# Patient Record
Sex: Female | Born: 1990 | Race: White | Hispanic: No | Marital: Single | Smoking: Current every day smoker
Health system: Southern US, Community
[De-identification: ages and names within clinical notes are randomized; demographics above are authoritative.]

## PROBLEM LIST (undated history)

## (undated) ENCOUNTER — Emergency Department (HOSPITAL_COMMUNITY): Admission: EM | Payer: 59 | Source: Home / Self Care

## (undated) DIAGNOSIS — IMO0002 Reserved for concepts with insufficient information to code with codable children: Secondary | ICD-10-CM

## (undated) DIAGNOSIS — K219 Gastro-esophageal reflux disease without esophagitis: Secondary | ICD-10-CM

## (undated) DIAGNOSIS — G4700 Insomnia, unspecified: Secondary | ICD-10-CM

## (undated) DIAGNOSIS — F329 Major depressive disorder, single episode, unspecified: Secondary | ICD-10-CM

## (undated) DIAGNOSIS — F32A Depression, unspecified: Secondary | ICD-10-CM

## (undated) HISTORY — PX: APPENDECTOMY: SHX54

---
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
## 2009-02-25 ENCOUNTER — Emergency Department (HOSPITAL_COMMUNITY): Admission: EM | Admit: 2009-02-25 | Discharge: 2009-02-25 | Payer: Self-pay | Admitting: Emergency Medicine

## 2009-05-04 ENCOUNTER — Emergency Department (HOSPITAL_COMMUNITY): Admission: EM | Admit: 2009-05-04 | Discharge: 2009-05-04 | Payer: Self-pay | Admitting: Emergency Medicine

## 2009-07-10 ENCOUNTER — Emergency Department (HOSPITAL_COMMUNITY): Admission: EM | Admit: 2009-07-10 | Discharge: 2009-07-10 | Payer: Self-pay | Admitting: Emergency Medicine

## 2009-10-05 IMAGING — CR DG ABDOMEN 1V
1 series · 1 of 1 positions shown · non-contrast
Comparison: None

CLINICAL DATA: Abdominal pain, nausea, vomiting

ABDOMEN - 1 VIEW

[view not recorded]
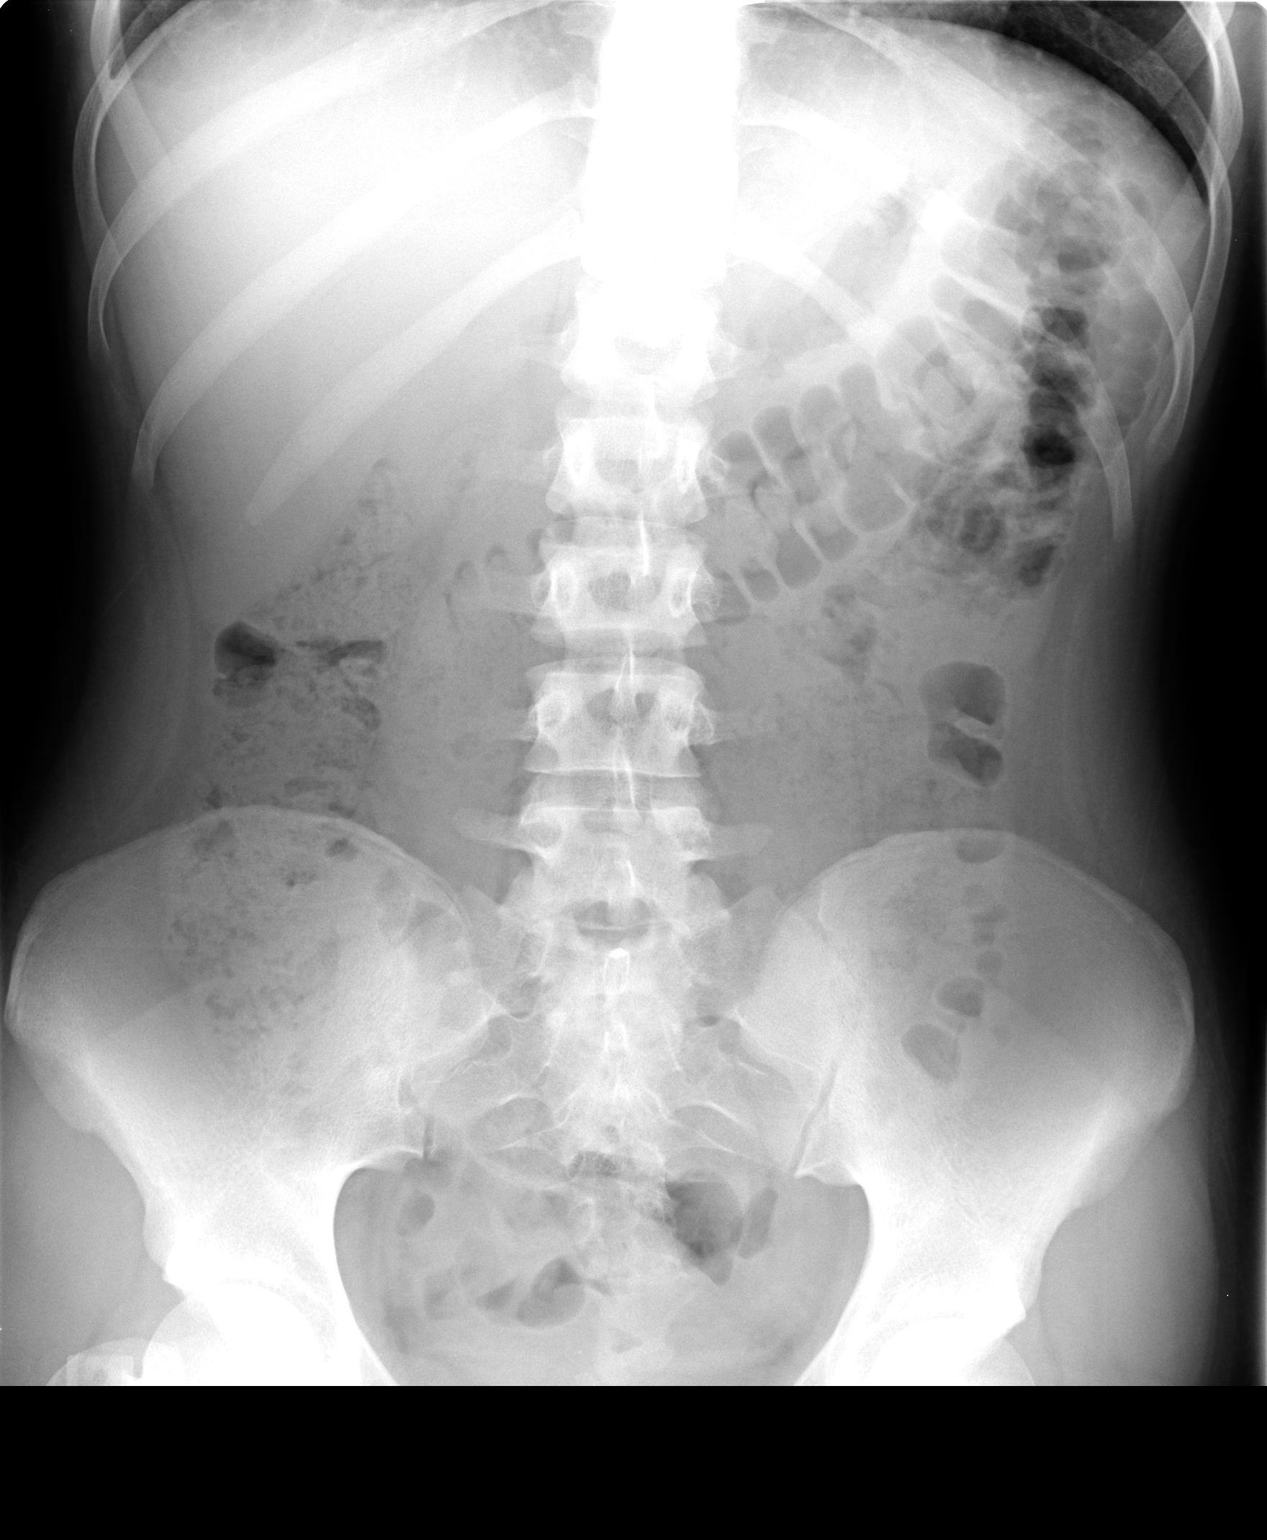

[1 of 1 positions shown; findings below may reference images not displayed]

FINDINGS: Stool throughout right colon.
Otherwise normal bowel gas pattern.
Bones unremarkable.
No pathologic calcification.
IMPRESSION: No acute abnormalities.

## 2009-10-14 IMAGING — CR DG PELVIS 1-2V
1 series · 1 of 1 positions shown · non-contrast
Comparison: None

CLINICAL DATA: Fell today.  Pain in the posterior pelvis.

PELVIS - 1-2 VIEW

[view not recorded]
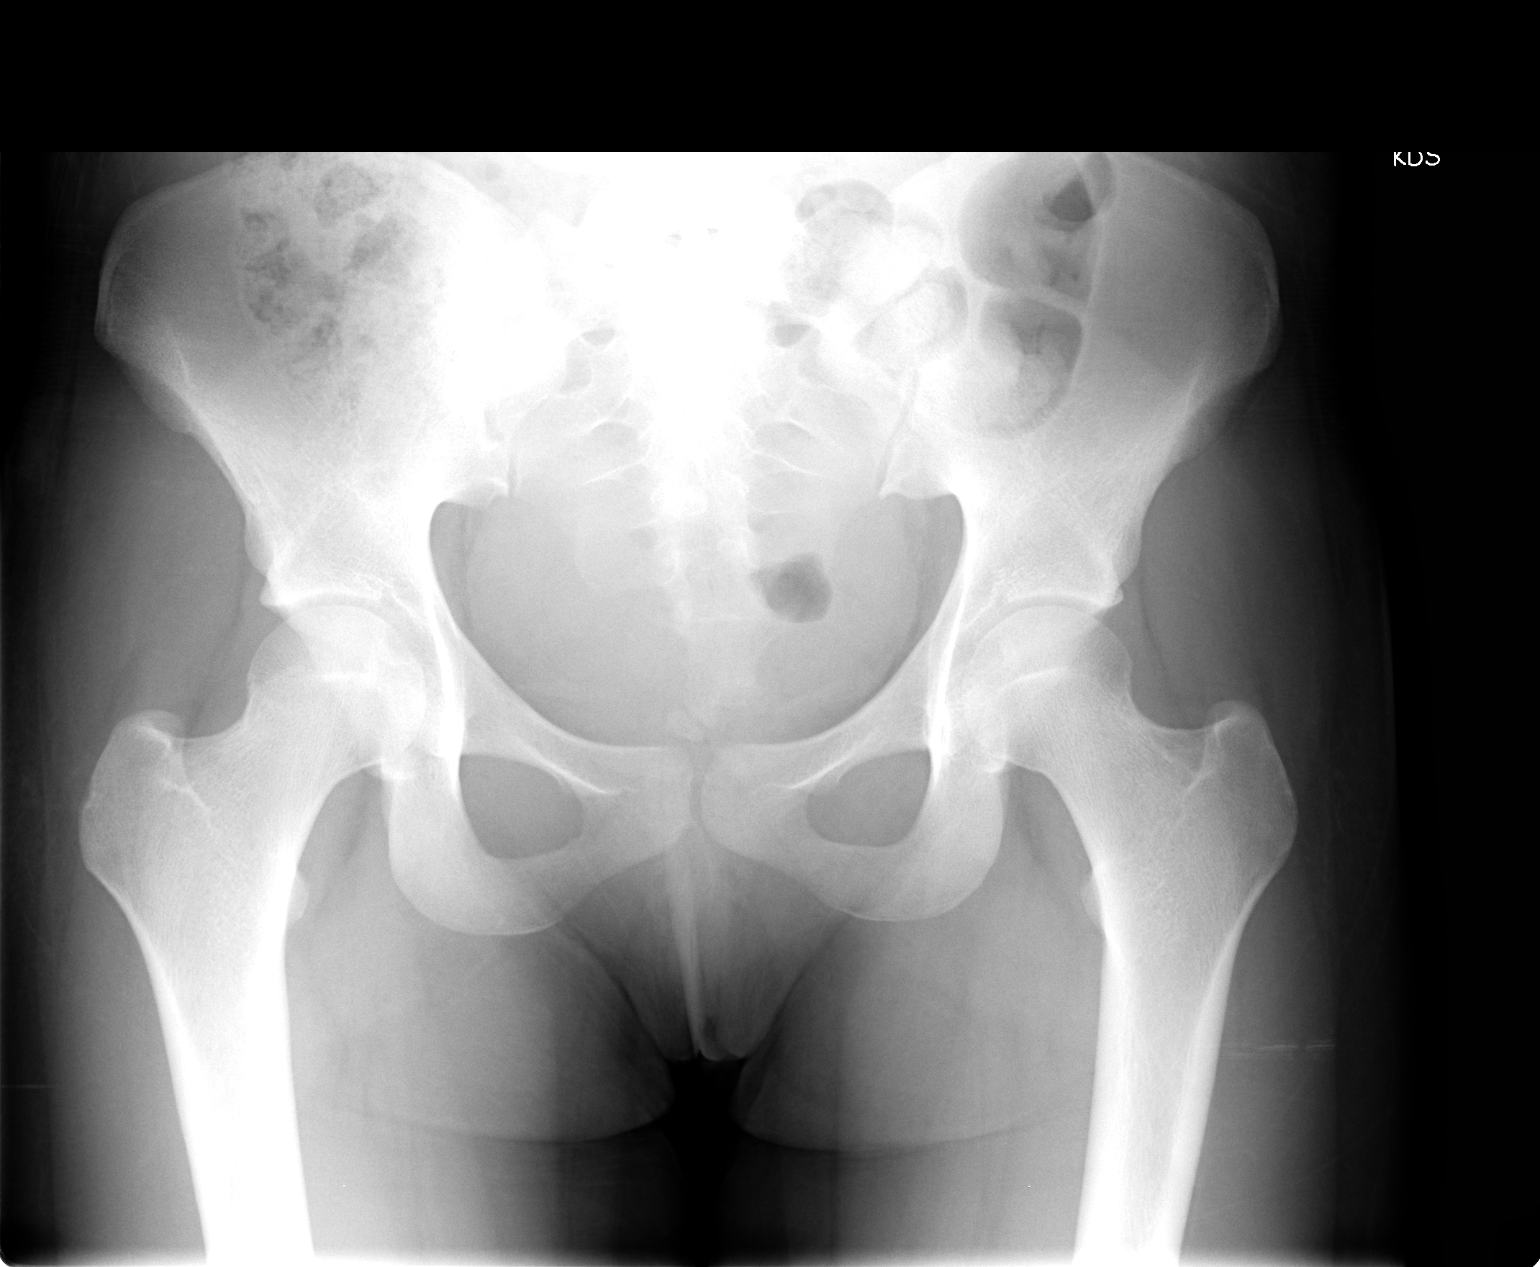

[1 of 1 positions shown; findings below may reference images not displayed]

FINDINGS: There is no evidence for acute fracture or dislocation.
Regional bowel gas has a normal appearance.
IMPRESSION: No evidence for acute abnormality.

## 2009-12-05 ENCOUNTER — Inpatient Hospital Stay (HOSPITAL_COMMUNITY): Admission: AD | Admit: 2009-12-05 | Discharge: 2009-12-08 | Payer: Self-pay | Admitting: Obstetrics & Gynecology

## 2009-12-05 ENCOUNTER — Ambulatory Visit: Payer: Self-pay | Admitting: Family

## 2009-12-05 ENCOUNTER — Inpatient Hospital Stay (HOSPITAL_COMMUNITY): Admission: AD | Admit: 2009-12-05 | Discharge: 2009-12-05 | Payer: Self-pay | Admitting: Obstetrics & Gynecology

## 2010-05-04 ENCOUNTER — Emergency Department (HOSPITAL_COMMUNITY): Admission: EM | Admit: 2010-05-04 | Discharge: 2010-05-04 | Payer: Self-pay | Admitting: Emergency Medicine

## 2010-05-27 ENCOUNTER — Encounter (INDEPENDENT_AMBULATORY_CARE_PROVIDER_SITE_OTHER): Payer: Self-pay | Admitting: General Surgery

## 2010-05-27 ENCOUNTER — Observation Stay (HOSPITAL_COMMUNITY): Admission: EM | Admit: 2010-05-27 | Discharge: 2010-05-27 | Payer: Self-pay | Admitting: Emergency Medicine

## 2011-02-01 LAB — POCT PREGNANCY, URINE: Preg Test, Ur: NEGATIVE

## 2011-02-01 LAB — DIFFERENTIAL
Basophils Absolute: 0 10*3/uL (ref 0.0–0.1)
Basophils Relative: 0 % (ref 0–1)
Eosinophils Relative: 1 % (ref 0–5)
Lymphocytes Relative: 18 % (ref 12–46)
Lymphs Abs: 1.6 10*3/uL (ref 0.7–4.0)
Monocytes Absolute: 0.5 10*3/uL (ref 0.1–1.0)
Neutrophils Relative %: 75 % (ref 43–77)

## 2011-02-01 LAB — BASIC METABOLIC PANEL
BUN: 10 mg/dL (ref 6–23)
CO2: 24 mEq/L (ref 19–32)
Chloride: 105 mEq/L (ref 96–112)
Glucose, Bld: 135 mg/dL — ABNORMAL HIGH (ref 70–99)
Potassium: 3.8 mEq/L (ref 3.5–5.1)

## 2011-02-01 LAB — URINALYSIS, ROUTINE W REFLEX MICROSCOPIC
Glucose, UA: NEGATIVE mg/dL
Leukocytes, UA: NEGATIVE
Nitrite: NEGATIVE
Urobilinogen, UA: 0.2 mg/dL (ref 0.0–1.0)
pH: 6 (ref 5.0–8.0)

## 2011-02-01 LAB — URINE MICROSCOPIC-ADD ON

## 2011-02-01 LAB — CBC
HCT: 38.5 % (ref 36.0–46.0)
MCHC: 35 g/dL (ref 30.0–36.0)
MCV: 92.7 fL (ref 78.0–100.0)

## 2011-02-01 LAB — LIPASE, BLOOD: Lipase: 28 U/L (ref 11–59)

## 2011-02-02 LAB — CBC
RBC: 3.76 MIL/uL — ABNORMAL LOW (ref 3.87–5.11)
WBC: 14.2 10*3/uL — ABNORMAL HIGH (ref 4.0–10.5)

## 2011-02-23 LAB — URINALYSIS, ROUTINE W REFLEX MICROSCOPIC
Bilirubin Urine: NEGATIVE
Glucose, UA: NEGATIVE mg/dL
Leukocytes, UA: NEGATIVE
Urobilinogen, UA: 0.2 mg/dL (ref 0.0–1.0)
pH: 5.5 (ref 5.0–8.0)

## 2011-02-23 LAB — HCG, QUANTITATIVE, PREGNANCY: hCG, Beta Chain, Quant, S: 69795 m[IU]/mL — ABNORMAL HIGH (ref <5)

## 2011-02-23 LAB — URINE MICROSCOPIC-ADD ON

## 2011-03-31 NOTE — H&P (Signed)
[name], [name]             ACCOUNT NO.:  [id number]   MEDICAL RECORD NO.:  [id number]          PATIENT TYPE:  INP   LOCATION:  0105                          FACILITY:  [facility]   PHYSICIAN:  Lalla Brothers, MDDATE OF BIRTH:  [birth year]   DATE OF ADMISSION:  09/02/2007  DATE OF DISCHARGE:                       PSYCHIATRIC ADMISSION ASSESSMENT   IDENTIFICATION:  This 6[age]54/20-year-old female, who dropped out of the  11th grade at [employer] this fall, is admitted emergently  involuntarily on a [facility] petition for commitment in transfer  from [facility] Emergency Department for inpatient  stabilization and treatment of depression and suicide risk.  The patient  was accompanied by mother who has had a stroke in August of 2008 to the  emergency department for a transverse laceration of the left dorsal  forearm requiring 12 sutures.  The patient has multiple other carvings  and cuttings on the left upper extremity and has a history of cutting  two years before, requiring therapy at [facility].  The patient appears to have a relapse in significant depression but at  this time presents as being overwhelmed with mother's stroke in August  of 2008 which apparently has plateaued in improvement.  The patient had  a knife or a razor to self-inflict a laceration and could not contract  for safety.   HISTORY OF PRESENT ILLNESS:  The patient appears to have longstanding  neurotic anxiety or characterologic sarcastic doubt and devaluation of  the relations and interactions promised by others.  The patient's father  died when the patient was 88 years of age.  Mother has now had a stroke  in August of 2008 and is not recovering completely.  The patient has  aspirations to be a Risk analyst and has had to drop out of school  to stay with mother.  The patient is sleeping two or three hours nightly  currently.  She has had weight loss and  diminished appetite.  The  patient is morbidly fixated and self-injurious.  She seems to have some  guilty ruminations at the same time that she is angry and beginning to  open up about these.  The patient has had no organic central nervous  system trauma.  She is not acknowledging post-traumatic flashbacks or  reexperiencing.  She will not share the content or accomplishments of  therapy two years ago at [facility] but it appeared  to have been for cutting and depression.  She has no other treatment  including no pharmacotherapy.  She smokes one pack per day of cigarettes  but denies the use of any alcohol or illicit drugs.  She has had no  organic central nervous system trauma.  The patient has attempted to  take care of mother but is currently stressing mother by her own needs.   PAST MEDICAL HISTORY:  The patient has 12 sutures in the left dorsal  forearm in a running fashion of 4.0 nylon.  The patient is to see Dr.  Syliva Overman in [place] for suture removal in 10 days.  The  patient cut through  the subcutaneous tissue but not into vital  structures.  The patient has multiple sites of self-cutting and carving  including the words life, love and hate.  She was in the emergency  department in April of 2005 with right knee pain with x-ray negative at  that time.  On August 22, 2007, she had an internal hordeolum of the  left eye that is resolved.  Last menses was August 16, 2007 and  menses have been irregular.  She has gastroesophageal reflux and  possibly some gastritis and has been taking Prilosec daily for the  intense burning.  She has had an ovarian cyst in the past.  She had  chicken pox in the second grade.  She wears eyeglasses.  Last dental  exam was one week ago.  She does have headaches at times.  She has no  medication allergies.  She has no seizures or syncope.  She has no heart  murmur or arrhythmia.   REVIEW OF SYSTEMS:  The patient  denies difficulty with gait, gaze or  continence.  She denies exposure to communicable disease or toxins.  She  has no headache or sensory loss currently.  There is no memory loss or  coordination deficit.  There is no cough, congestion, chest pain,  palpitations, dyspnea or presyncope.  There is no abdominal pain,  nausea, vomiting or diarrhea.  There is no dysuria or arthralgia.   IMMUNIZATIONS:  Up-to-date.   FAMILY HISTORY:  The patient resides with mother and stepfather as well  as a brother.  The patient and brother have significantly placed their  lives on hold to help the mother.  The mother apparently made some  initial progress but has plateaued.  Mother had a stroke in 2007/07/11.  The patient's father died when the patient was 44 years of age.   SOCIAL AND DEVELOPMENTAL HISTORY:  The patient dropped out of the 11th  grade at [employer] in order to help mother recover from her  stroke.  Mother has made only partial progress.  The patient wants a  career in Primary school teacher.  She likes music, wrestling, drawing and is  employed at [employer] though less than 32 hours weekly.  She does use  tobacco as one pack per day of cigarettes but denies alcohol or illicit  drugs.  She denies other legal charges currently.  She does not answer  questions about sexual activity.   ASSETS:  The patient has educational and occupational plans for the  future.   MENTAL STATUS EXAM:  Height is 155 cm and weight is 48 kg.  Blood  pressure is 102/72 with heart rate of 77 (sitting) and 106/72 with heart  rate of 90 (standing).  She is right-handed.  Her hair is cut short  without a specific shape in a masculine fashion.  She has life, love and  hate carved into her forearms.  She has the laceration on the left volar  forearm and scattered abrasions elsewhere on the left upper extremity.  Cranial nerves 2-12 are intact.  Muscle strengths and tone are normal.  There are no pathologic  reflexes or soft neurologic findings.  The  patient has severe dysphoria and is significantly defensive about  current and past family losses.  The patient has a somewhat alienating  and sarcastic interpersonal style that softens with female nursing staff  to tabulate losses and consequences.  She has identity defensiveness,  diffusion and dissonance, at times manifesting borderline,  passive-  aggressive or narcissistic traits though these are not pervasive.  She  has severe dysphoria and object loss.  She seems to have neurotic  anxiety to be distinguished in ongoing treatment from character  dysfunction.  She has no mania or psychosis.  She is hypersensitive to  the comments or actions of others.  She has suicidal ideation and  intent.   IMPRESSION:  AXIS I:  Major depression, recurrent, severe.  Anxiety  disorder not otherwise specified.  Identity disorder.  Parent-child  problem.  Other interpersonal problem.  Other specified family  circumstances.  AXIS II:  Diagnosis deferred.  AXIS III:  Lacerations self-inflicted, particularly the left upper  extremity, gastroesophageal reflux disorder and possible gastritis,  recent left eye internal hordeolum, irregular menses, eyeglasses,  headache.  AXIS IV:  Stressors:  Family--extreme, acute and chronic; phase of life-  -extreme, acute and chronic; school--moderate, acute and chronic;  medical--mild, acute and chronic.  AXIS V:  GAF on admission 30; highest in last year estimated at 72.   PLAN:  The patient is admitted for inpatient adolescent psychiatric and  multidisciplinary multimodal behavioral health treatment in a team-based  programmatic locked psychiatric unit.  Nicoderm patch is made available  and wound care is planned including with Neosporin, multivitamin and  dressing changes.  Prozac pharmacotherapy is to be considered though  mother could not come to the phone and stepfather was not available with  multiple trials and  calls.  Celexa might be another option.  Cognitive  behavioral therapy, anger management, interpersonal therapy, habit  reversal, identity consolidation, family intervention, individuation  separation, social and communication skill training and problem-solving  and coping skill training can be undertaken for therapy.   ESTIMATED LENGTH OF STAY:  Seven days with target symptoms for discharge  being stabilization of suicide risk and mood, stabilization of  dangerous, disruptive behavior and highly defended anxiety and  generalization of the capacity for safe, effective participation in  outpatient treatment.      Lalla Brothers, MD  Electronically Signed     GEJ/MEDQ  D:  09/03/2007  T:  09/04/2007  Job:  [id number]
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
## 2011-08-15 ENCOUNTER — Encounter: Payer: Self-pay | Admitting: *Deleted

## 2011-08-15 ENCOUNTER — Emergency Department (HOSPITAL_COMMUNITY)
Admission: EM | Admit: 2011-08-15 | Discharge: 2011-08-15 | Disposition: A | Discharge status: Home / Self Care | Payer: Medicaid Other | Attending: Emergency Medicine | Admitting: Emergency Medicine

## 2011-08-15 ENCOUNTER — Emergency Department (HOSPITAL_COMMUNITY): Payer: Medicaid Other

## 2011-08-15 DIAGNOSIS — S6390XA Sprain of unspecified part of unspecified wrist and hand, initial encounter: Secondary | ICD-10-CM | POA: Insufficient documentation

## 2011-08-15 DIAGNOSIS — S63619A Unspecified sprain of unspecified finger, initial encounter: Secondary | ICD-10-CM

## 2011-08-15 DIAGNOSIS — IMO0002 Reserved for concepts with insufficient information to code with codable children: Secondary | ICD-10-CM | POA: Insufficient documentation

## 2011-08-15 DIAGNOSIS — F17200 Nicotine dependence, unspecified, uncomplicated: Secondary | ICD-10-CM | POA: Insufficient documentation

## 2011-08-15 DIAGNOSIS — M79609 Pain in unspecified limb: Secondary | ICD-10-CM | POA: Insufficient documentation

## 2011-08-15 NOTE — ED Provider Notes (Signed)
History     CSN: [id number] Arrival date & time: 08/15/2011 11:13 AM  Chief Complaint  Patient presents with  . Hand Pain    (Consider location/radiation/quality/duration/timing/severity/associated sxs/prior treatment) Patient is a 20 y.o. female presenting with hand pain. The history is provided by the patient.  Hand Pain This is a recurrent problem. The current episode started 1 to 4 weeks ago. The problem occurs intermittently. The problem has been gradually worsening. Pertinent negatives include no abdominal pain, arthralgias, chest pain, coughing or neck pain. Exacerbated by: Movement of the rt fifth finger. She has tried acetaminophen for the symptoms. The treatment provided no relief.    History reviewed. No pertinent past medical history.  Past Surgical History  Procedure Date  . Appendectomy     History reviewed. No pertinent family history.  History  Substance Use Topics  . Smoking status: Current Everyday Smoker -- 2.0 packs/day  . Smokeless tobacco: Not on file  . Alcohol Use: No    OB History    Grav Para Term Preterm Abortions TAB SAB Ect Mult Living                  Review of Systems  Constitutional: Negative for activity change.       All ROS Neg except as noted in HPI  HENT: Negative for nosebleeds and neck pain.   Eyes: Negative for photophobia and discharge.  Respiratory: Negative for cough, shortness of breath and wheezing.   Cardiovascular: Negative for chest pain and palpitations.  Gastrointestinal: Negative for abdominal pain and blood in stool.  Genitourinary: Negative for dysuria, frequency and hematuria.  Musculoskeletal: Negative for back pain and arthralgias.  Skin: Negative.   Neurological: Negative for dizziness, seizures and speech difficulty.  Psychiatric/Behavioral: Negative for hallucinations and confusion.    Allergies  Penicillins  Home Medications  No current outpatient prescriptions on file.  BP 110/66  Pulse 62   Temp(Src) 98.2 F (36.8 C) (Oral)  Resp 18  Ht 5\' 4"  (1.626 m)  Wt 110 lb (49.896 kg)  BMI 18.88 kg/m2  SpO2 96%  LMP 08/08/2011  Physical Exam  Nursing note and vitals reviewed. Constitutional: She is oriented to person, place, and time. She appears well-developed and well-nourished.  Non-toxic appearance.  HENT:  Head: Normocephalic.  Right Ear: Tympanic membrane and external ear normal.  Left Ear: Tympanic membrane and external ear normal.  Eyes: EOM and lids are normal. Pupils are equal, round, and reactive to light.  Neck: Normal range of motion. Neck supple. Carotid bruit is not present.  Cardiovascular: Normal rate, regular rhythm, normal heart sounds, intact distal pulses and normal pulses.   Pulmonary/Chest: Breath sounds normal. No respiratory distress.  Abdominal: Soft. Bowel sounds are normal. There is no tenderness. There is no guarding.  Musculoskeletal: Normal range of motion.       Pain at the MP of the right 5th finger. Mild swelling. Good cap refill. Not hot. Sensory wnl.  Lymphadenopathy:       Head (right side): No submandibular adenopathy present.       Head (left side): No submandibular adenopathy present.    She has no cervical adenopathy.  Neurological: She is alert and oriented to person, place, and time. She has normal strength. No cranial nerve deficit or sensory deficit.  Skin: Skin is warm and dry.  Psychiatric: She has a normal mood and affect. Her speech is normal.    ED Course  Procedures (including critical care time)  Labs Reviewed -  No data to display Dg Finger Little Right  08/15/2011  *RADIOLOGY REPORT*  Clinical Data: Jammed little finger.  Proximal finger pain.  RIGHT LITTLE FINGER 2+V  Comparison: None.  Findings: No evidence of fracture or dislocation.  No other significant bone abnormality identified.  Soft tissues are unremarkable.  IMPRESSION: Negative.  Original Report Authenticated By: Danae Orleans, M.D.     Dx: Finger  sprain   MDM  I have reviewed nursing notes, vital signs, and all appropriate lab and imaging results for this patient.  Plan: finger splint. See Dr Romeo Apple if not improving.     Kathie Dike, [place] 08/15/11 1242

## 2011-08-15 NOTE — ED Notes (Signed)
Pt c/o pain to her right small finger. Pt states she bent her finger back about 3 weeks ago and it has been hurting worse since then.

## 2011-08-15 NOTE — ED Notes (Signed)
Pt c/o pain in her right little finger. States that she injured it a couple days ago. Able to bend finger but causes pain. Pt has strong radial pulse and finger pink and warm to touch.

## 2011-08-15 NOTE — ED Provider Notes (Signed)
Medical screening examination/treatment/procedure(s) were performed by non-physician practitioner and as supervising physician I was immediately available for consultation/collaboration.   Joya Gaskins, MD 08/15/11 [phone]

## 2011-08-26 LAB — CBC
Hemoglobin: 13.8
RBC: 4.4
RDW: 13.1

## 2011-08-26 LAB — URINALYSIS, ROUTINE W REFLEX MICROSCOPIC
Bilirubin Urine: NEGATIVE
Glucose, UA: NEGATIVE
Hgb urine dipstick: NEGATIVE
Ketones, ur: NEGATIVE
Protein, ur: NEGATIVE
Urobilinogen, UA: 0.2
pH: 7

## 2011-08-26 LAB — BASIC METABOLIC PANEL
Calcium: 9.7
Glucose, Bld: 101 — ABNORMAL HIGH
Sodium: 140

## 2011-08-26 LAB — HEPATIC FUNCTION PANEL
ALT: 10
AST: 16
Indirect Bilirubin: 0.7
Total Protein: 6.8

## 2011-08-26 LAB — DIFFERENTIAL
Basophils Absolute: 0.1
Lymphocytes Relative: 30
Monocytes Absolute: 0.8
Neutro Abs: 6.9 — ABNORMAL HIGH
Neutrophils Relative %: 62

## 2011-08-26 LAB — RAPID URINE DRUG SCREEN, HOSP PERFORMED
Amphetamines: NOT DETECTED
Tetrahydrocannabinol: NOT DETECTED

## 2011-08-26 LAB — GAMMA GT: GGT: 18

## 2011-08-26 LAB — TSH: TSH: 1.099

## 2012-04-14 ENCOUNTER — Encounter (HOSPITAL_COMMUNITY): Payer: Self-pay | Admitting: *Deleted

## 2012-04-14 ENCOUNTER — Emergency Department (HOSPITAL_COMMUNITY): Payer: Self-pay

## 2012-04-14 ENCOUNTER — Emergency Department (HOSPITAL_COMMUNITY)
Admission: EM | Admit: 2012-04-14 | Discharge: 2012-04-14 | Disposition: A | Discharge status: Home / Self Care | Payer: Self-pay | Attending: Emergency Medicine | Admitting: Emergency Medicine

## 2012-04-14 DIAGNOSIS — J40 Bronchitis, not specified as acute or chronic: Secondary | ICD-10-CM | POA: Insufficient documentation

## 2012-04-14 DIAGNOSIS — R059 Cough, unspecified: Secondary | ICD-10-CM | POA: Insufficient documentation

## 2012-04-14 DIAGNOSIS — R05 Cough: Secondary | ICD-10-CM | POA: Insufficient documentation

## 2012-04-14 DIAGNOSIS — R079 Chest pain, unspecified: Secondary | ICD-10-CM | POA: Insufficient documentation

## 2012-04-14 DIAGNOSIS — M549 Dorsalgia, unspecified: Secondary | ICD-10-CM | POA: Insufficient documentation

## 2012-04-14 DIAGNOSIS — F17200 Nicotine dependence, unspecified, uncomplicated: Secondary | ICD-10-CM | POA: Insufficient documentation

## 2012-04-14 DIAGNOSIS — R0789 Other chest pain: Secondary | ICD-10-CM

## 2012-04-14 DIAGNOSIS — K219 Gastro-esophageal reflux disease without esophagitis: Secondary | ICD-10-CM | POA: Insufficient documentation

## 2012-04-14 HISTORY — DX: Depression, unspecified: F32.A

## 2012-04-14 HISTORY — DX: Reserved for concepts with insufficient information to code with codable children: IMO0002

## 2012-04-14 HISTORY — DX: Gastro-esophageal reflux disease without esophagitis: K21.9

## 2012-04-14 HISTORY — DX: Major depressive disorder, single episode, unspecified: F32.9

## 2012-04-14 HISTORY — DX: Insomnia, unspecified: G47.00

## 2012-04-14 MED ORDER — GUAIFENESIN-CODEINE 100-10 MG/5ML PO SYRP: ORAL_SOLUTION | ORAL | Status: DC

## 2012-04-14 NOTE — ED Notes (Signed)
Productive of green phelgm x 4 weeks, c/o mid back pain for 2.5 weeks

## 2012-04-14 NOTE — ED Notes (Signed)
Patient transported to X-ray 

## 2012-04-14 NOTE — ED Provider Notes (Signed)
History     CSN: [id number]  Arrival date & time 04/14/12  1933   First MD Initiated Contact with Patient 04/14/12 2033      Chief Complaint  Patient presents with  . Cough  . Back Pain    (Consider location/radiation/quality/duration/timing/severity/associated sxs/prior treatment) HPI Comments: Cough prod of green sputum ~ 1 month.  "sharp" CP to L anterior and posterior lateral  Area.  Worse with deep inspiration and coughing..  No fever or chills.  No n/v, diaphoresis or radiation.  Patient is a 21 y.o. female presenting with cough and back pain. The history is provided by the patient. No language interpreter was used.  Cough This is a new problem. Episode onset: 1 month ago. The problem occurs constantly. The problem has not changed since onset.The cough is productive of sputum. There has been no fever. Associated symptoms include chest pain. Pertinent negatives include no chills, no weight loss, no shortness of breath and no wheezing. She has tried nothing for the symptoms. She is a smoker. Her past medical history does not include bronchitis, pneumonia, bronchiectasis, COPD, emphysema or asthma.  Back Pain  Associated symptoms include chest pain. Pertinent negatives include no fever and no weight loss.    Past Medical History  Diagnosis Date  . Insomnia   . Depression   . Ulcer   . GERD (gastroesophageal reflux disease)     Past Surgical History  Procedure Date  . Appendectomy     History reviewed. No pertinent family history.  History  Substance Use Topics  . Smoking status: Current Everyday Smoker -- 2.0 packs/day for 4 years    Types: Cigarettes  . Smokeless tobacco: Not on file  . Alcohol Use: No    OB History    Grav Para Term Preterm Abortions TAB SAB Ect Mult Living                  Review of Systems  Constitutional: Negative for fever, chills and weight loss.  Respiratory: Positive for cough. Negative for shortness of breath and wheezing.     Cardiovascular: Positive for chest pain.  Musculoskeletal: Positive for back pain.  All other systems reviewed and are negative.    Allergies  Penicillins and Latex  Home Medications   Current Outpatient Rx  Name Route Sig Dispense Refill  . ACETAMINOPHEN 500 MG PO TABS Oral Take 1,000 mg by mouth once as needed. For pain    . OMEPRAZOLE 20 MG PO CPDR Oral Take 20 mg by mouth daily.    . GUAIFENESIN-CODEINE 100-10 MG/5ML PO SYRP  10 mls po q 4-6 hrs prn cough 240 mL 0    BP 115/68  Pulse 83  Temp(Src) 98.1 F (36.7 C) (Oral)  Resp 20  Ht 5\' 2"  (1.575 m)  Wt 125 lb (56.7 kg)  BMI 22.86 kg/m2  SpO2 100%  LMP 03/20/2012  Physical Exam  Nursing note and vitals reviewed. Constitutional: She is oriented to person, place, and time. She appears well-developed and well-nourished. No distress.  HENT:  Head: Normocephalic and atraumatic.  Eyes: EOM are normal.  Neck: Normal range of motion.  Cardiovascular: Normal rate, regular rhythm and normal heart sounds.   Pulmonary/Chest: Effort normal and breath sounds normal. No respiratory distress. She has no wheezes. She has no rales.   She exhibits no tenderness.    Abdominal: Soft. She exhibits no distension. There is no tenderness.  Musculoskeletal: Normal range of motion.  Neurological: She is alert and oriented  to person, place, and time.  Skin: Skin is warm and dry.  Psychiatric: She has a normal mood and affect. Judgment normal.    ED Course  Procedures (including critical care time)  Labs Reviewed - No data to display Dg Chest 2 View  04/14/2012  *RADIOLOGY REPORT*  Clinical Data: Cough for 4 weeks.  Left lateral chest pain. Smoker.  CHEST - 2 VIEW  Comparison: None.  Findings: Hyperinflation suggesting asthma versus emphysematous changes.  Normal heart size and pulmonary vascularity.  Azygos lobe.  No focal airspace consolidation.  No blunting of costophrenic angles.  No pneumothorax.  Calcified granuloma in the right  apex.  IMPRESSION: Hyperinflation suggesting asthma versus emphysema.  No evidence of active pulmonary disease.  Original Report Authenticated By: Marlon Pel, M.D.     1. Chest wall pain   2. Bronchitis       MDM  Nl CXR.  No cardiac sxs.   rx robitussin AC, 240 ml        Worthy Rancher, [place] 04/14/12 2208

## 2012-04-14 NOTE — Discharge Instructions (Signed)
Bronchitis Bronchitis is a problem of the air tubes leading to your lungs. This problem makes it hard for air to get in and out of the lungs. You may cough a lot because your air tubes are narrow. Going without care can cause lasting (chronic) bronchitis. HOME CARE   Drink enough fluids to keep your pee (urine) clear or pale yellow.   Use a cool mist humidifier.   Quit smoking if you smoke. If you keep smoking, the bronchitis might not get better.   Only take medicine as told by your doctor.  GET HELP RIGHT AWAY IF:   Coughing keeps you awake.   You start to wheeze.   You become more sick or weak.   You have a hard time breathing or get short of breath.   You cough up blood.   Coughing lasts more than 2 weeks.   You have a fever.   Your baby is older than 3 months with a rectal temperature of 102 F (38.9 C) or higher.   Your baby is 71 months old or younger with a rectal temperature of 100.4 F (38 C) or higher.  MAKE SURE YOU:  Understand these instructions.   Will watch your condition.   Will get help right away if you are not doing well or get worse.  Document Released: 04/20/2008 Document Revised: 10/22/2011 Document Reviewed: 10/04/2009 [facility] Patient Information 2012 [place], [place].Chest Wall Pain Chest wall pain is pain in or around the bones and muscles of your chest. It may take up to 6 weeks to get better. It may take longer if you must stay physically active in your work and activities.  CAUSES  Chest wall pain may happen on its own. However, it may be caused by:  A viral illness like the flu.   Injury.   Coughing.   Exercise.   Arthritis.   Fibromyalgia.   Shingles.  HOME CARE INSTRUCTIONS   Avoid overtiring physical activity. Try not to strain or perform activities that cause pain. This includes any activities using your chest or your abdominal and side muscles, especially if heavy weights are used.   Put ice on the sore area.   Put ice  in a plastic bag.   Place a towel between your skin and the bag.   Leave the ice on for 15 to 20 minutes per hour while awake for the first 2 days.   Only take over-the-counter or prescription medicines for pain, discomfort, or fever as directed by your caregiver.  SEEK IMMEDIATE MEDICAL CARE IF:   Your pain increases, or you are very uncomfortable.   You have a fever.   Your chest pain becomes worse.   You have new, unexplained symptoms.   You have nausea or vomiting.   You feel sweaty or lightheaded.   You have a cough with phlegm (sputum), or you cough up blood.  MAKE SURE YOU:   Understand these instructions.   Will watch your condition.   Will get help right away if you are not doing well or get worse.  Document Released: 11/02/2005 Document Revised: 10/22/2011 Document Reviewed: 06/29/2011 [facility] Patient Information 2012 [place], [place].   Take the cough medicine as directed.  Take ibuprofen up to 600 mg every 8 hrs with food.  Follow up with your MD as needed.

## 2012-04-14 NOTE — ED Notes (Signed)
Pt has been coughing for 2 weeks, back started hurting along with cough. Pt denies SOB, complains of pain when coughing.

## 2012-04-15 NOTE — ED Provider Notes (Signed)
Medical screening examination/treatment/procedure(s) were performed by non-physician practitioner and as supervising physician I was immediately available for consultation/collaboration.   Glynn Octave, MD 04/15/12 [phone]

## 2012-05-18 ENCOUNTER — Emergency Department (HOSPITAL_COMMUNITY): Payer: Medicaid Other

## 2012-05-18 ENCOUNTER — Encounter (HOSPITAL_COMMUNITY): Payer: Self-pay | Admitting: *Deleted

## 2012-05-18 ENCOUNTER — Emergency Department (HOSPITAL_COMMUNITY)
Admission: EM | Admit: 2012-05-18 | Discharge: 2012-05-18 | Disposition: A | Discharge status: Home / Self Care | Payer: Medicaid Other | Attending: Emergency Medicine | Admitting: Emergency Medicine

## 2012-05-18 DIAGNOSIS — K219 Gastro-esophageal reflux disease without esophagitis: Secondary | ICD-10-CM | POA: Insufficient documentation

## 2012-05-18 DIAGNOSIS — F3289 Other specified depressive episodes: Secondary | ICD-10-CM | POA: Insufficient documentation

## 2012-05-18 DIAGNOSIS — F329 Major depressive disorder, single episode, unspecified: Secondary | ICD-10-CM | POA: Insufficient documentation

## 2012-05-18 DIAGNOSIS — R112 Nausea with vomiting, unspecified: Secondary | ICD-10-CM | POA: Insufficient documentation

## 2012-05-18 DIAGNOSIS — F17200 Nicotine dependence, unspecified, uncomplicated: Secondary | ICD-10-CM | POA: Insufficient documentation

## 2012-05-18 DIAGNOSIS — R197 Diarrhea, unspecified: Secondary | ICD-10-CM | POA: Insufficient documentation

## 2012-05-18 DIAGNOSIS — R109 Unspecified abdominal pain: Secondary | ICD-10-CM | POA: Insufficient documentation

## 2012-05-18 LAB — COMPREHENSIVE METABOLIC PANEL
ALT: 10 U/L (ref 0–35)
AST: 15 U/L (ref 0–37)
Albumin: 4.5 g/dL (ref 3.5–5.2)
CO2: 25 mEq/L (ref 19–32)
Calcium: 9.9 mg/dL (ref 8.4–10.5)
GFR calc non Af Amer: >90 mL/min (ref >90)
Sodium: 139 mEq/L (ref 135–145)
Total Protein: 8 g/dL (ref 6.0–8.3)

## 2012-05-18 LAB — CBC WITH DIFFERENTIAL/PLATELET
Basophils Relative: 1 % (ref 0–1)
Eosinophils Absolute: 0.1 10*3/uL (ref 0.0–0.7)
Eosinophils Relative: 2 % (ref 0–5)
HCT: 40.6 % (ref 36.0–46.0)
Hemoglobin: 14 g/dL (ref 12.0–15.0)
MCH: 31.5 pg (ref 26.0–34.0)
MCHC: 34.5 g/dL (ref 30.0–36.0)
MCV: 91.4 fL (ref 78.0–100.0)
Monocytes Absolute: 0.4 10*3/uL (ref 0.1–1.0)
Monocytes Relative: 6 % (ref 3–12)

## 2012-05-18 LAB — URINALYSIS, ROUTINE W REFLEX MICROSCOPIC
Glucose, UA: NEGATIVE mg/dL
Leukocytes, UA: NEGATIVE
Protein, ur: NEGATIVE mg/dL
pH: 6 (ref 5.0–8.0)

## 2012-05-18 MED ORDER — HYDROMORPHONE HCL PF 1 MG/ML IJ SOLN
1.0000 mg | Freq: Once | INTRAMUSCULAR | Status: AC
  Administered 2012-05-18: 1 mg via INTRAVENOUS
  Filled 2012-05-18: qty 1

## 2012-05-18 MED ORDER — IOHEXOL 300 MG/ML  SOLN
100.0000 mL | Freq: Once | INTRAMUSCULAR | Status: AC | PRN
  Administered 2012-05-18: 100 mL via INTRAVENOUS

## 2012-05-18 MED ORDER — FAMOTIDINE 20 MG PO TABS: 20.0000 mg | ORAL_TABLET | Freq: Two times a day (BID) | ORAL | Status: DC

## 2012-05-18 MED ORDER — IOHEXOL 300 MG/ML  SOLN: 40.0000 mL | Freq: Once | INTRAMUSCULAR | Status: DC | PRN

## 2012-05-18 MED ORDER — SODIUM CHLORIDE 0.9 % IV SOLN: INTRAVENOUS | Status: DC

## 2012-05-18 MED ORDER — SODIUM CHLORIDE 0.9 % IV BOLUS (SEPSIS)
250.0000 mL | Freq: Once | INTRAVENOUS | Status: AC
  Administered 2012-05-18: 250 mL via INTRAVENOUS

## 2012-05-18 MED ORDER — HYDROCODONE-ACETAMINOPHEN 5-325 MG PO TABS: 1.0000 | ORAL_TABLET | Freq: Four times a day (QID) | ORAL | Status: AC | PRN

## 2012-05-18 MED ORDER — ONDANSETRON HCL 4 MG/2ML IJ SOLN
4.0000 mg | Freq: Once | INTRAMUSCULAR | Status: AC
  Administered 2012-05-18: 4 mg via INTRAVENOUS
  Filled 2012-05-18: qty 2

## 2012-05-18 NOTE — ED Notes (Signed)
Pt c/o mid-abd pain for over a week.  Pt reports that pain worsens when she eats.  Pt reports eating mostly spicy foods.

## 2012-05-18 NOTE — ED Provider Notes (Signed)
History   This chart was scribed for Catherine Jakes, MD by Melba Coon. The patient was seen in room APA19/APA19 and the patient's care was started at 5:27PM.    CSN: [id number]  Arrival date & time 05/18/12  1701   First MD Initiated Contact with Patient 05/18/12 1708      Chief Complaint  Patient presents with  . Abdominal Pain    (Consider location/radiation/quality/duration/timing/severity/associated sxs/prior treatment) HPI [name] is a 21 y.o. female who presents to the Emergency Department complaining of intermittent, moderate to severe, sharp upper abdominal pain with associated nausea, emesis, and diarrhea with an onset a week ago. Pt states that "on some days, it hurts all day." Pt rates the severity of the pain 8/10 when it is present. Hx of past episodes abd pain for past 4 years. LNMP: June 29th. HA, back pain, sore throat, and chills present. No fever, neck pain, rash, CP, SOB, dysuria, or extremity pain, edema, weakness, numbness, or tingling. Hx of appendectomy. Allergic to penicillin and latex. No other pertinent medical symptoms.  No PCP  Past Medical History  Diagnosis Date  . Insomnia   . Depression   . Ulcer   . GERD (gastroesophageal reflux disease)     Past Surgical History  Procedure Date  . Appendectomy     History reviewed. No pertinent family history.  History  Substance Use Topics  . Smoking status: Current Everyday Smoker -- 2.0 packs/day for 4 years    Types: Cigarettes  . Smokeless tobacco: Not on file  . Alcohol Use: Yes    OB History    Grav Para Term Preterm Abortions TAB SAB Ect Mult Living                  Review of Systems 10 Systems reviewed and all are negative for acute change except as noted in the HPI.   Allergies  Penicillins and Latex  Home Medications   Current Outpatient Rx  Name Route Sig Dispense Refill  . ACETAMINOPHEN 500 MG PO TABS Oral Take 1,000 mg by mouth once as needed. For pain    .  OMEPRAZOLE 20 MG PO CPDR Oral Take 20 mg by mouth daily.    Marland Kitchen FAMOTIDINE 20 MG PO TABS Oral Take 1 tablet (20 mg total) by mouth 2 (two) times daily. 30 tablet 0  . HYDROCODONE-ACETAMINOPHEN 5-325 MG PO TABS Oral Take 1-2 tablets by mouth every 6 (six) hours as needed for pain. 10 tablet 0    BP 113/79  Pulse 60  Temp 98 F (36.7 C) (Oral)  Resp 20  Ht 5\' 3"  (1.6 m)  Wt 117 lb (53.071 kg)  BMI 20.73 kg/m2  SpO2 100%  LMP 05/16/2012  Physical Exam  Nursing note and vitals reviewed. Constitutional: She is oriented to person, place, and time. She appears well-developed and well-nourished. No distress.  HENT:  Head: Normocephalic and atraumatic.  Right Ear: External ear normal.  Left Ear: External ear normal.  Eyes: EOM are normal.  Neck: Normal range of motion. No tracheal deviation present.  Cardiovascular: Normal rate, regular rhythm and normal heart sounds.   No murmur heard. Pulmonary/Chest: Effort normal and breath sounds normal. No respiratory distress. She has no wheezes.  Abdominal: Soft. Bowel sounds are normal. There is tenderness (bilateral UQ and epigastric tenderness; greater pain in epigastric). There is no rebound and no guarding.  Musculoskeletal: Normal range of motion. She exhibits no edema and no tenderness.  Neurological: She  is alert and oriented to person, place, and time. No cranial nerve deficit.  Skin: Skin is warm and dry. No rash noted.  Psychiatric: She has a normal mood and affect. Her behavior is normal.    ED Course  Procedures (including critical care time)  DIAGNOSTIC STUDIES: Oxygen Saturation is 100% on room air, normal by my interpretation.    COORDINATION OF CARE:  5:32PM - EDMD will order IV fluids, zofran, dilaudid, abd CT, blood w/u and UA for the pt.   Results for orders placed during the hospital encounter of 05/18/12  URINALYSIS, ROUTINE W REFLEX MICROSCOPIC      Component Value Range   Color, Urine YELLOW  YELLOW   APPearance  CLEAR  CLEAR   Specific Gravity, Urine 1.025  1.005 - 1.030   pH 6.0  5.0 - 8.0   Glucose, UA NEGATIVE  NEGATIVE mg/dL   Hgb urine dipstick NEGATIVE  NEGATIVE   Bilirubin Urine NEGATIVE  NEGATIVE   Ketones, ur NEGATIVE  NEGATIVE mg/dL   Protein, ur NEGATIVE  NEGATIVE mg/dL   Urobilinogen, UA 0.2  0.0 - 1.0 mg/dL   Nitrite NEGATIVE  NEGATIVE   Leukocytes, UA NEGATIVE  NEGATIVE  COMPREHENSIVE METABOLIC PANEL      Component Value Range   Sodium 139  135 - 145 mEq/L   Potassium 3.8  3.5 - 5.1 mEq/L   Chloride 103  96 - 112 mEq/L   CO2 25  19 - 32 mEq/L   Glucose, Bld 89  70 - 99 mg/dL   BUN 13  6 - 23 mg/dL   Creatinine, Ser 9.62  0.50 - 1.10 mg/dL   Calcium 9.9  8.4 - 95.2 mg/dL   Total Protein 8.0  6.0 - 8.3 g/dL   Albumin 4.5  3.5 - 5.2 g/dL   AST 15  0 - 37 U/L   ALT 10  0 - 35 U/L   Alkaline Phosphatase 81  39 - 117 U/L   Total Bilirubin 0.2 (*) 0.3 - 1.2 mg/dL   GFR calc non Af Amer >90  >90 mL/min   GFR calc Af Amer >90  >90 mL/min  CBC WITH DIFFERENTIAL      Component Value Range   WBC 6.8  4.0 - 10.5 K/uL   RBC 4.44  3.87 - 5.11 MIL/uL   Hemoglobin 14.0  12.0 - 15.0 g/dL   HCT 84.1  32.4 - 40.1 %   MCV 91.4  78.0 - 100.0 fL   MCH 31.5  26.0 - 34.0 pg   MCHC 34.5  30.0 - 36.0 g/dL   RDW 02.7  25.3 - 66.4 %   Platelets 227  150 - 400 K/uL   Neutrophils Relative 50  43 - 77 %   Neutro Abs 3.4  1.7 - 7.7 K/uL   Lymphocytes Relative 42  12 - 46 %   Lymphs Abs 2.9  0.7 - 4.0 K/uL   Monocytes Relative 6  3 - 12 %   Monocytes Absolute 0.4  0.1 - 1.0 K/uL   Eosinophils Relative 2  0 - 5 %   Eosinophils Absolute 0.1  0.0 - 0.7 K/uL   Basophils Relative 1  0 - 1 %   Basophils Absolute 0.1  0.0 - 0.1 K/uL  PREGNANCY, URINE      Component Value Range   Preg Test, Ur NEGATIVE  NEGATIVE  LIPASE, BLOOD      Component Value Range   Lipase 28  11 -  59 U/L   Ct Abdomen Pelvis W Contrast  05/18/2012  *RADIOLOGY REPORT*  Clinical Data: Mid abdominal pain.  Prior appendectomy.   CT ABDOMEN AND PELVIS WITH CONTRAST  Technique:  Multidetector CT imaging of the abdomen and pelvis was performed following the standard protocol during bolus administration of intravenous contrast.  Contrast: OMNIPAQUE IOHEXOL 300 MG/ML  SOLN  Comparison: 05/27/2010.  Findings: No extraluminal bowel inflammatory process, free fluid or free air.  No calcified gallstones.  Common bile duct is prominent at 5.8 mm.  This was noted previously.  No pancreatic mass or calcified common bile duct stone noted.  No intrahepatic biliary duct dilatation.  No focal hepatic, splenic, adrenal or renal mass.  No abdominal aortic aneurysm.  No adenopathy.  Noncontrast filled views of the urinary bladder unremarkable. Right adnexa top normal in size.  This is without change.  No bony destructive lesion.  Lung bases clear.  IMPRESSION: No acute abnormality.  Please see above.  Original Report Authenticated By: Fuller Canada, M.D.     1. Abdominal pain       MDM  Workup for abdominal pain in the emergency part without significant lab findings pretty test negative urinalysis normal CT scan without evidence of gallstones or other abnormalities could be related to a stomach ulcer we'll treat with Pepcid and pain medicine resource guide provided for followup. No evidence of gallstones no evidence of pancreatitis no evidence of pregnancy or urinary tract infection.    I personally performed the services described in this documentation, which was scribed in my presence. The recorded information has been reviewed and considered.           Catherine Jakes, MD 05/18/12 2136

## 2012-05-18 NOTE — ED Notes (Signed)
abd pain, nausea,vomiting,diarrhea, No fever.

## 2012-05-29 ENCOUNTER — Encounter (HOSPITAL_COMMUNITY): Payer: Self-pay

## 2012-05-29 ENCOUNTER — Emergency Department (HOSPITAL_COMMUNITY)
Admission: EM | Admit: 2012-05-29 | Discharge: 2012-05-29 | Disposition: A | Discharge status: Home / Self Care | Payer: Medicaid Other | Attending: Emergency Medicine | Admitting: Emergency Medicine

## 2012-05-29 DIAGNOSIS — R109 Unspecified abdominal pain: Secondary | ICD-10-CM

## 2012-05-29 DIAGNOSIS — R197 Diarrhea, unspecified: Secondary | ICD-10-CM | POA: Insufficient documentation

## 2012-05-29 DIAGNOSIS — R51 Headache: Secondary | ICD-10-CM | POA: Insufficient documentation

## 2012-05-29 DIAGNOSIS — R42 Dizziness and giddiness: Secondary | ICD-10-CM | POA: Insufficient documentation

## 2012-05-29 DIAGNOSIS — K219 Gastro-esophageal reflux disease without esophagitis: Secondary | ICD-10-CM | POA: Insufficient documentation

## 2012-05-29 DIAGNOSIS — R079 Chest pain, unspecified: Secondary | ICD-10-CM | POA: Insufficient documentation

## 2012-05-29 LAB — COMPREHENSIVE METABOLIC PANEL
AST: 16 U/L (ref 0–37)
Albumin: 4.4 g/dL (ref 3.5–5.2)
BUN: 8 mg/dL (ref 6–23)
CO2: 27 mEq/L (ref 19–32)
Calcium: 9.7 mg/dL (ref 8.4–10.5)
Creatinine, Ser: 0.59 mg/dL (ref 0.50–1.10)
GFR calc non Af Amer: >90 mL/min (ref >90)
Total Bilirubin: 0.5 mg/dL (ref 0.3–1.2)

## 2012-05-29 LAB — LIPASE, BLOOD: Lipase: 24 U/L (ref 11–59)

## 2012-05-29 LAB — CBC WITH DIFFERENTIAL/PLATELET
Basophils Absolute: 0 10*3/uL (ref 0.0–0.1)
Basophils Relative: 0 % (ref 0–1)
Eosinophils Relative: 1 % (ref 0–5)
HCT: 40.6 % (ref 36.0–46.0)
Hemoglobin: 13.9 g/dL (ref 12.0–15.0)
MCHC: 34.2 g/dL (ref 30.0–36.0)
MCV: 91.9 fL (ref 78.0–100.0)
Monocytes Absolute: 0.4 10*3/uL (ref 0.1–1.0)
Monocytes Relative: 6 % (ref 3–12)
RDW: 13.2 % (ref 11.5–15.5)

## 2012-05-29 LAB — URINALYSIS, ROUTINE W REFLEX MICROSCOPIC
Ketones, ur: NEGATIVE mg/dL
Leukocytes, UA: NEGATIVE
Nitrite: NEGATIVE
Protein, ur: NEGATIVE mg/dL

## 2012-05-29 MED ORDER — METOCLOPRAMIDE HCL 5 MG/ML IJ SOLN
10.0000 mg | Freq: Once | INTRAMUSCULAR | Status: AC
  Administered 2012-05-29: 10 mg via INTRAVENOUS
  Filled 2012-05-29: qty 2

## 2012-05-29 MED ORDER — SODIUM CHLORIDE 0.9 % IV SOLN: 1000.0000 mL | INTRAVENOUS | Status: DC

## 2012-05-29 MED ORDER — PROMETHAZINE HCL 25 MG RE SUPP: 25.0000 mg | Freq: Four times a day (QID) | RECTAL | Status: DC | PRN

## 2012-05-29 MED ORDER — PANTOPRAZOLE SODIUM 40 MG IV SOLR
40.0000 mg | Freq: Once | INTRAVENOUS | Status: AC
  Administered 2012-05-29: 40 mg via INTRAVENOUS
  Filled 2012-05-29: qty 40

## 2012-05-29 MED ORDER — GI COCKTAIL ~~LOC~~
30.0000 mL | Freq: Once | ORAL | Status: AC
  Administered 2012-05-29: 30 mL via ORAL
  Filled 2012-05-29: qty 30

## 2012-05-29 MED ORDER — SODIUM CHLORIDE 0.9 % IV SOLN
1000.0000 mL | Freq: Once | INTRAVENOUS | Status: AC
  Administered 2012-05-29: 1000 mL via INTRAVENOUS

## 2012-05-29 MED ORDER — PROMETHAZINE HCL 25 MG/ML IJ SOLN
12.5000 mg | Freq: Once | INTRAMUSCULAR | Status: AC
  Administered 2012-05-29: 12.5 mg via INTRAVENOUS
  Filled 2012-05-29: qty 1

## 2012-05-29 MED ORDER — DIPHENHYDRAMINE HCL 50 MG/ML IJ SOLN
25.0000 mg | Freq: Once | INTRAMUSCULAR | Status: AC
  Administered 2012-05-29: 25 mg via INTRAVENOUS
  Filled 2012-05-29: qty 1

## 2012-05-29 NOTE — ED Provider Notes (Signed)
History  This chart was scribed for Ward Givens, MD by Ladona Ridgel Day. This patient was seen in room APA03/APA03 and the patient's care was started at 1406.  CSN: [id number]  Arrival date & time 05/29/12  1406   First MD Initiated Contact with Patient 05/29/12 1454      Chief Complaint  Patient presents with  . Abdominal Pain    The history is provided by the patient. No language interpreter was used.   [name] is a 21 y.o. female who presents to the Emergency Department complaining of constant abdominal pain which began 4 weeks ago and had similar episode when she was seen here 11 days ago in the ED. She states chest pain, back pain, dull/achy migraines, burning fluid in throat last PM, nausea, 1 episode of emesis, and diarrhea x4 per day as associated symptoms. She denies any fever or blood in her stool. She states modifying factors as standing up makes her feel dizzy and a little light headed. She also states no relief from her OTC acid reflux medication . When she was seen here on July 3rd she states she had a normal abdominal CT and had also been having 2 weeks of abdominal pain before her previous visit here. She is a current everyday smoker and occasional alcohol user.  Pt has had abdominal pain for a while and states she was supposed to have an EDG done last year at [facility] but didn't keep her appointment.   She is not followed by a PCP  Past Medical History  Diagnosis Date  . Insomnia   . Depression   . Ulcer   . GERD (gastroesophageal reflux disease)     Past Surgical History  Procedure Date  . Appendectomy     No family history on file.  History  Substance Use Topics  . Smoking status: Current Everyday Smoker -- 2.0 packs/day for 4 years    Types: Cigarettes  . Smokeless tobacco: Not on file  . Alcohol Use: Yes  employed  OB History    Grav Para Term Preterm Abortions TAB SAB Ect Mult Living                  Review of Systems  Constitutional: Negative  for fever and chills.  HENT: Negative for ear pain, congestion and sore throat.   Eyes: Negative for redness and visual disturbance.  Respiratory: Negative for chest tightness.   Cardiovascular: Positive for chest pain.  Gastrointestinal: Positive for nausea, vomiting (1 episode of emesis.) and diarrhea (4 episodes per day of loose stool. ).  Genitourinary: Negative for dysuria and difficulty urinating.  Musculoskeletal: Positive for back pain.  Skin: Negative for color change and pallor.  Neurological: Positive for dizziness, light-headedness (With standing. ) and headaches (She states has dull/achy migraines that last all day when she gets them. ). Negative for numbness.  All other systems reviewed and are negative.    Allergies  Penicillins and Latex  Home Medications   Current Outpatient Rx  Name Route Sig Dispense Refill  . ACETAMINOPHEN 500 MG PO TABS Oral Take 1,000 mg by mouth once as needed. For pain    . FAMOTIDINE 20 MG PO TABS Oral Take 1 tablet (20 mg total) by mouth 2 (two) times daily. 30 tablet 0  . HYDROCODONE-ACETAMINOPHEN 5-325 MG PO TABS Oral Take 1-2 tablets by mouth every 6 (six) hours as needed for pain. 10 tablet 0    Triage Vitals: BP 107/69  Pulse  77  Temp 98.2 F (36.8 C) (Oral)  Resp 18  Ht 5\' 3"  (1.6 m)  Wt 117 lb (53.071 kg)  BMI 20.73 kg/m2  LMP 05/14/2012  Vital signs normal    Physical Exam  Nursing note and vitals reviewed. Constitutional: She is oriented to person, place, and time. She appears well-developed and well-nourished.  HENT:  Head: Normocephalic and atraumatic.  Right Ear: External ear normal.  Left Ear: External ear normal.  Nose: Nose normal.       She has dry mucous membranes.   Eyes: Conjunctivae and EOM are normal. Pupils are equal, round, and reactive to light.  Neck: Normal range of motion. Neck supple.  Cardiovascular: Normal rate, regular rhythm and normal heart sounds.   Pulmonary/Chest: Effort normal and breath  sounds normal. No respiratory distress. She has no wheezes. She has no rales.  Abdominal: Soft. Bowel sounds are normal. She exhibits no distension. There is tenderness (Mild diffuse upper abdominal pain. ). There is no rebound and no guarding.  Musculoskeletal: Normal range of motion. She exhibits no edema and no tenderness.  Neurological: She is alert and oriented to person, place, and time.  Skin: Skin is warm and dry.  Psychiatric: She has a normal mood and affect. Her behavior is normal.    ED Course  Procedures (including critical care time)   Medications  0.9 %  sodium chloride infusion (1000 mL Intravenous New Bag/Given 05/29/12 1544)    Followed by  0.9 %  sodium chloride infusion (not administered)    Followed by  0.9 %  sodium chloride infusion (not administered)  promethazine (PHENERGAN) injection 12.5 mg (12.5 mg Intravenous Given 05/29/12 1537)  metoCLOPramide (REGLAN) injection 10 mg (10 mg Intravenous Given 05/29/12 1534)  diphenhydrAMINE (BENADRYL) injection 25 mg (25 mg Intravenous Given 05/29/12 1542)  pantoprazole (PROTONIX) injection 40 mg (40 mg Intravenous Given 05/29/12 1540)  gi cocktail (Maalox,Lidocaine,Donnatal) (30 mL Oral Given 05/29/12 1542)     DIAGNOSTIC STUDIES:   COORDINATION OF CARE: At 309 PM Discussed treatment plan with patient which includes IV fluids, GI cocktail, blood work, urine analysis, and I recommended smoking cessation. Patient agrees.   1700: Discussed negative lab results and negative imaging results. Pt states that she is feeling improved and ready to go home. Pt on second bag of IV fluids and has good urine output.   Results for orders placed during the hospital encounter of 05/29/12  CBC WITH DIFFERENTIAL      Component Value Range   WBC 6.9  4.0 - 10.5 K/uL   RBC 4.42  3.87 - 5.11 MIL/uL   Hemoglobin 13.9  12.0 - 15.0 g/dL   HCT 16.1  09.6 - 04.5 %   MCV 91.9  78.0 - 100.0 fL   MCH 31.4  26.0 - 34.0 pg   MCHC 34.2  30.0 -  36.0 g/dL   RDW 40.9  81.1 - 91.4 %   Platelets 221  150 - 400 K/uL   Neutrophils Relative 65  43 - 77 %   Neutro Abs 4.5  1.7 - 7.7 K/uL   Lymphocytes Relative 28  12 - 46 %   Lymphs Abs 1.9  0.7 - 4.0 K/uL   Monocytes Relative 6  3 - 12 %   Monocytes Absolute 0.4  0.1 - 1.0 K/uL   Eosinophils Relative 1  0 - 5 %   Eosinophils Absolute 0.1  0.0 - 0.7 K/uL   Basophils Relative 0  0 - 1 %  Basophils Absolute 0.0  0.0 - 0.1 K/uL  COMPREHENSIVE METABOLIC PANEL      Component Value Range   Sodium 135  135 - 145 mEq/L   Potassium 4.0  3.5 - 5.1 mEq/L   Chloride 101  96 - 112 mEq/L   CO2 27  19 - 32 mEq/L   Glucose, Bld 93  70 - 99 mg/dL   BUN 8  6 - 23 mg/dL   Creatinine, Ser 1.61  0.50 - 1.10 mg/dL   Calcium 9.7  8.4 - 09.6 mg/dL   Total Protein 7.4  6.0 - 8.3 g/dL   Albumin 4.4  3.5 - 5.2 g/dL   AST 16  0 - 37 U/L   ALT 17  0 - 35 U/L   Alkaline Phosphatase 68  39 - 117 U/L   Total Bilirubin 0.5  0.3 - 1.2 mg/dL   GFR calc non Af Amer >90  >90 mL/min   GFR calc Af Amer >90  >90 mL/min  LIPASE, BLOOD      Component Value Range   Lipase 24  11 - 59 U/L  URINALYSIS, ROUTINE W REFLEX MICROSCOPIC      Component Value Range   Color, Urine YELLOW  YELLOW   APPearance CLEAR  CLEAR   Specific Gravity, Urine <1.005 (*) 1.005 - 1.030   pH 6.5  5.0 - 8.0   Glucose, UA NEGATIVE  NEGATIVE mg/dL   Hgb urine dipstick NEGATIVE  NEGATIVE   Bilirubin Urine NEGATIVE  NEGATIVE   Ketones, ur NEGATIVE  NEGATIVE mg/dL   Protein, ur NEGATIVE  NEGATIVE mg/dL   Urobilinogen, UA 0.2  0.0 - 1.0 mg/dL   Nitrite NEGATIVE  NEGATIVE   Leukocytes, UA NEGATIVE  NEGATIVE   Laboratory interpretation all normal   Ct Abdomen Pelvis W Contrast  05/18/2012  *RADIOLOGY REPORT*  Clinical Data: Mid abdominal pain.  Prior appendectomy.  CT ABDOMEN AND PELVIS WITH CONTRAST  Technique:  Multidetector CT imaging of the abdomen and pelvis was performed following the standard protocol during bolus administration of  intravenous contrast.  Contrast: OMNIPAQUE IOHEXOL 300 MG/ML  SOLN  Comparison: 05/27/2010.  Findings: No extraluminal bowel inflammatory process, free fluid or free air.  No calcified gallstones.  Common bile duct is prominent at 5.8 mm.  This was noted previously.  No pancreatic mass or calcified common bile duct stone noted.  No intrahepatic biliary duct dilatation.  No focal hepatic, splenic, adrenal or renal mass.  No abdominal aortic aneurysm.  No adenopathy.  Noncontrast filled views of the urinary bladder unremarkable. Right adnexa top normal in size.  This is without change.  No bony destructive lesion.  Lung bases clear.  IMPRESSION: No acute abnormality.  Please see above.  Original Report Authenticated By: Fuller Canada, M.D.     1. Abdominal pain   2. GERD (gastroesophageal reflux disease)    New Prescriptions   PROMETHAZINE (PHENERGAN) 25 MG SUPPOSITORY    Place 1 suppository (25 mg total) rectally every 6 (six) hours as needed for nausea.  Start prilosec or protonix OTC twice a day  Plan discharge  Devoria Albe, MD, FACEP     MDM   I personally performed the services described in this documentation, which was scribed in my presence. The recorded information has been reviewed and considered.  Devoria Albe, MD, Armando Gang        Ward Givens, MD 05/29/12 [phone]

## 2012-05-29 NOTE — ED Notes (Signed)
Ab pain since June, describes as "twisting her intestines", +nausea, no vomiting, pain worse after eating, diarrhea at times.  Denies any fever, also having migraines.  Has been seen once for same, had ct scan and was normal.

## 2013-04-21 ENCOUNTER — Emergency Department (HOSPITAL_COMMUNITY)
Admission: EM | Admit: 2013-04-21 | Discharge: 2013-04-21 | Disposition: A | Discharge status: Home / Self Care | Payer: Medicaid Other | Attending: Emergency Medicine | Admitting: Emergency Medicine

## 2013-04-21 ENCOUNTER — Emergency Department (HOSPITAL_COMMUNITY): Payer: Medicaid Other

## 2013-04-21 ENCOUNTER — Encounter (HOSPITAL_COMMUNITY): Payer: Self-pay | Admitting: *Deleted

## 2013-04-21 DIAGNOSIS — Z8669 Personal history of other diseases of the nervous system and sense organs: Secondary | ICD-10-CM | POA: Insufficient documentation

## 2013-04-21 DIAGNOSIS — F17200 Nicotine dependence, unspecified, uncomplicated: Secondary | ICD-10-CM | POA: Insufficient documentation

## 2013-04-21 DIAGNOSIS — S6390XA Sprain of unspecified part of unspecified wrist and hand, initial encounter: Secondary | ICD-10-CM | POA: Insufficient documentation

## 2013-04-21 DIAGNOSIS — Z91040 Latex allergy status: Secondary | ICD-10-CM | POA: Insufficient documentation

## 2013-04-21 DIAGNOSIS — Y939 Activity, unspecified: Secondary | ICD-10-CM | POA: Insufficient documentation

## 2013-04-21 DIAGNOSIS — Y929 Unspecified place or not applicable: Secondary | ICD-10-CM | POA: Insufficient documentation

## 2013-04-21 DIAGNOSIS — F329 Major depressive disorder, single episode, unspecified: Secondary | ICD-10-CM | POA: Insufficient documentation

## 2013-04-21 DIAGNOSIS — Z880 Allergy status to penicillin: Secondary | ICD-10-CM | POA: Insufficient documentation

## 2013-04-21 DIAGNOSIS — S63619A Unspecified sprain of unspecified finger, initial encounter: Secondary | ICD-10-CM

## 2013-04-21 DIAGNOSIS — W2209XA Striking against other stationary object, initial encounter: Secondary | ICD-10-CM | POA: Insufficient documentation

## 2013-04-21 DIAGNOSIS — Z872 Personal history of diseases of the skin and subcutaneous tissue: Secondary | ICD-10-CM | POA: Insufficient documentation

## 2013-04-21 DIAGNOSIS — Z8719 Personal history of other diseases of the digestive system: Secondary | ICD-10-CM | POA: Insufficient documentation

## 2013-04-21 DIAGNOSIS — F3289 Other specified depressive episodes: Secondary | ICD-10-CM | POA: Insufficient documentation

## 2013-04-21 MED ORDER — TRAMADOL HCL 50 MG PO TABS: 50.0000 mg | ORAL_TABLET | Freq: Four times a day (QID) | ORAL | Status: DC | PRN

## 2013-04-21 NOTE — ED Notes (Signed)
Pt states she hit her right index finger earlier today. Pain to finger.

## 2013-04-22 NOTE — ED Provider Notes (Signed)
History     CSN: [id number]  Arrival date & time 04/21/13  1201   First MD Initiated Contact with Patient 04/21/13 1238      Chief Complaint  Patient presents with  . Finger Injury    (Consider location/radiation/quality/duration/timing/severity/associated sxs/prior treatment) HPI Comments: [name] is a 22 y.o. Female presenting with pain and swelling to her right index finger after accidentally hitting it against the door several hours ago.  She has constant throbbing pain which radiates into her mid hand along with swelling of the finger.  She denies numbness or weakness distal to the injury site.  She has applied ice to the finger which has not relieved her symptoms.  Pain is worse with palpation and range of motion.  The history is provided by the patient.    Past Medical History  Diagnosis Date  . Insomnia   . Depression   . Ulcer   . GERD (gastroesophageal reflux disease)     Past Surgical History  Procedure Laterality Date  . Appendectomy      No family history on file.  History  Substance Use Topics  . Smoking status: Current Every Day Smoker -- 2.00 packs/day for 4 years    Types: Cigarettes  . Smokeless tobacco: Not on file  . Alcohol Use: Yes    OB History   Grav Para Term Preterm Abortions TAB SAB Ect Mult Living                  Review of Systems  Constitutional: Negative for fever.  Musculoskeletal: Positive for joint swelling and arthralgias. Negative for myalgias.  Neurological: Negative for weakness and numbness.    Allergies  Penicillins and Latex  Home Medications   Current Outpatient Rx  Name  Route  Sig  Dispense  Refill  . traMADol (ULTRAM) 50 MG tablet   Oral   Take 1 tablet (50 mg total) by mouth every 6 (six) hours as needed for pain.   20 tablet   0     BP 105/68  Pulse 90  Temp(Src) 97.6 F (36.4 C) (Oral)  Resp 16  Ht 5\' 2"  (1.575 m)  Wt 115 lb (52.164 kg)  BMI 21.03 kg/m2  SpO2 100%  LMP  04/21/2013  Physical Exam  Constitutional: She appears well-developed and well-nourished.  HENT:  Head: Atraumatic.  Neck: Normal range of motion.  Cardiovascular:  Pulses equal bilaterally  Musculoskeletal: She exhibits tenderness.       Right hand: She exhibits tenderness, bony tenderness and swelling. She exhibits normal capillary refill and no deformity. Normal sensation noted. Normal strength noted.       Hands: Neurological: She is alert. She has normal strength. She displays normal reflexes. No sensory deficit.  Equal strength  Skin: Skin is warm and dry.  Psychiatric: She has a normal mood and affect.    ED Course  Procedures (including critical care time)  Labs Reviewed - No data to display Dg Finger Index Right  04/21/2013   *RADIOLOGY REPORT*  Clinical Data: Trauma, right PIP joint pain/swelling  RIGHT INDEX FINGER 2+V  Comparison: None.  Findings: No fracture or dislocation is seen.  The joint spaces are preserved.  Moderate soft tissue swelling along the proximal digit.  Multiple tiny densities on the frontal radiograph are without corresponding abnormality on additional views and likely reflect artifact.  IMPRESSION: No fracture or dislocation is seen.  Moderate soft tissue swelling.   Original Report Authenticated By: Charline Bills, M.D.  1. Finger sprain, initial encounter       MDM  Patients labs and/or radiological studies were viewed and considered during the medical decision making and disposition process.  X-rays were discussed and shown to the patient.  She was placed in a finger splint for comfort and protection.  She was encouraged rest, ice and elevation.  She was prescribed tramadol for pain relief.  Also encouraged ibuprofen which she has at home.  Followup with PCP if not improved over the next week.        Burgess Amor, PA-C 04/22/13 1730

## 2013-04-25 NOTE — ED Provider Notes (Signed)
Medical screening examination/treatment/procedure(s) were performed by non-physician practitioner and as supervising physician I was immediately available for consultation/collaboration. Devoria Albe, MD, Armando Gang   Ward Givens, MD 04/25/13 [phone]

## 2013-04-26 ENCOUNTER — Telehealth: Payer: Self-pay | Admitting: Orthopedic Surgery

## 2013-04-26 NOTE — Telephone Encounter (Signed)
Patient called following Emergency Room visit at [facility], 04/21/13 for problem of finger injury; states had seen Emergency Room physician there in September of 2012 also.  She also relates that she had seen a doctor at [facility] for this problem "in-between."  Advised patient of need for notes, Xray reports & films for the [facility] medical services.  Also relayed to patient of the requirement for referral from primary care physician, per her Medicaid insurance.  Emergency room notes from 04/21/13 also indicate, contact primary care physician if follow up needed.  Patient will call back or have primary care contact our office if referral made.

## 2013-05-26 ENCOUNTER — Encounter (HOSPITAL_COMMUNITY): Payer: Self-pay

## 2013-05-26 ENCOUNTER — Emergency Department (HOSPITAL_COMMUNITY)
Admission: EM | Admit: 2013-05-26 | Discharge: 2013-05-26 | Discharge status: Against Medical Advice | Payer: Medicaid Other | Attending: Emergency Medicine | Admitting: Emergency Medicine

## 2013-05-26 DIAGNOSIS — R1110 Vomiting, unspecified: Secondary | ICD-10-CM | POA: Insufficient documentation

## 2013-05-26 DIAGNOSIS — R109 Unspecified abdominal pain: Secondary | ICD-10-CM | POA: Insufficient documentation

## 2013-05-26 NOTE — ED Notes (Signed)
Stomach has been hurting on the lower right side and I have been vomiting per pt. Started around Tuesday per friend.

## 2015-10-22 ENCOUNTER — Emergency Department (HOSPITAL_COMMUNITY): Payer: Medicaid Other

## 2015-10-22 ENCOUNTER — Emergency Department (HOSPITAL_COMMUNITY)
Admission: EM | Admit: 2015-10-22 | Discharge: 2015-10-22 | Disposition: A | Discharge status: Home / Self Care | Payer: Medicaid Other | Attending: Emergency Medicine | Admitting: Emergency Medicine

## 2015-10-22 ENCOUNTER — Encounter (HOSPITAL_COMMUNITY): Payer: Self-pay | Admitting: *Deleted

## 2015-10-22 DIAGNOSIS — M5030 Other cervical disc degeneration, unspecified cervical region: Secondary | ICD-10-CM | POA: Insufficient documentation

## 2015-10-22 DIAGNOSIS — F17210 Nicotine dependence, cigarettes, uncomplicated: Secondary | ICD-10-CM | POA: Insufficient documentation

## 2015-10-22 DIAGNOSIS — Z8669 Personal history of other diseases of the nervous system and sense organs: Secondary | ICD-10-CM | POA: Insufficient documentation

## 2015-10-22 DIAGNOSIS — M47812 Spondylosis without myelopathy or radiculopathy, cervical region: Secondary | ICD-10-CM

## 2015-10-22 DIAGNOSIS — Z880 Allergy status to penicillin: Secondary | ICD-10-CM | POA: Insufficient documentation

## 2015-10-22 DIAGNOSIS — F329 Major depressive disorder, single episode, unspecified: Secondary | ICD-10-CM | POA: Insufficient documentation

## 2015-10-22 DIAGNOSIS — Z8719 Personal history of other diseases of the digestive system: Secondary | ICD-10-CM | POA: Insufficient documentation

## 2015-10-22 DIAGNOSIS — Z91040 Latex allergy status: Secondary | ICD-10-CM | POA: Insufficient documentation

## 2015-10-22 DIAGNOSIS — M62838 Other muscle spasm: Secondary | ICD-10-CM

## 2015-10-22 DIAGNOSIS — M62830 Muscle spasm of back: Secondary | ICD-10-CM | POA: Insufficient documentation

## 2015-10-22 MED ORDER — DEXAMETHASONE 4 MG PO TABS: 4.0000 mg | ORAL_TABLET | Freq: Two times a day (BID) | ORAL | Status: DC

## 2015-10-22 MED ORDER — METHOCARBAMOL 500 MG PO TABS: 500.0000 mg | ORAL_TABLET | Freq: Three times a day (TID) | ORAL | Status: DC

## 2015-10-22 NOTE — ED Notes (Addendum)
Pt comes in for a fall one month ago. Pt states she landed on her back, hitting her head.  She has had neck pain since.  Pt denies any loss of consciousness. Pt denies any vomiting.

## 2015-10-22 NOTE — Discharge Instructions (Signed)
Your x-ray is negative for fracture or dislocation. There does appear to be some arthritis changes, and a small area of muscle spasm present. Heat to your neck and shoulders may be helpful. Please use Decadron 2 times daily with food. Please use Robaxin 3 times daily for muscle spasm. Robaxin may cause drowsiness, please use with caution. Please see Dr. Ophelia CharterYates, or the orthopedic specialist of your choice for additional orthopedic evaluation concerning your discomfort.

## 2015-10-22 NOTE — ED Provider Notes (Signed)
CSN: [id number]     Arrival date & time 10/22/15  1433 History   First MD Initiated Contact with Patient 10/22/15 1604     Chief Complaint  Patient presents with  . Neck Pain     (Consider location/radiation/quality/duration/timing/severity/associated sxs/prior Treatment) HPI Comments: Patient is a 24 year old female who presents to the emergency department with a complaint of neck pain.  The patient states that approximately one month ago she sustained a fall, injuring her back, neck, and hitting her head. She states that since that time she's been having more and more problems with her neck and shoulder areas. She presents to the emergency room today to find out "why she is continuing to hurt so much". The patient denies any loss of consciousness. There's been no difficulty with walking. There's been no problem with dropping objects.  Patient is a 24 y.o. female presenting with neck pain. The history is provided by the patient.  Neck Pain   Past Medical History  Diagnosis Date  . Insomnia   . Depression   . Ulcer   . GERD (gastroesophageal reflux disease)    Past Surgical History  Procedure Laterality Date  . Appendectomy     No family history on file. Social History  Substance Use Topics  . Smoking status: Current Every Day Smoker -- 1.00 packs/day for 4 years    Types: Cigarettes  . Smokeless tobacco: None  . Alcohol Use: Yes   OB History    No data available     Review of Systems  Musculoskeletal: Positive for neck pain.  Psychiatric/Behavioral: Positive for sleep disturbance.       Depression  All other systems reviewed and are negative.     Allergies  Penicillins and Latex  Home Medications   Prior to Admission medications   Medication Sig Start Date End Date Taking? Authorizing Provider  naproxen sodium (ANAPROX) 220 MG tablet Take 660 mg by mouth daily as needed (for pain).   Yes Historical Provider, MD   BP 134/67 mmHg  Pulse 73  Temp(Src) 97.9  F (36.6 C) (Oral)  Resp 20  Ht  (1.575 m)  Wt 56.7 kg  BMI 22.86 kg/m2  SpO2 99%  LMP 10/15/2015 Physical Exam  Constitutional: She is oriented to person, place, and time. She appears well-developed and well-nourished.  Non-toxic appearance.  HENT:  Head: Normocephalic.  Right Ear: Tympanic membrane and external ear normal.  Left Ear: Tympanic membrane and external ear normal.  Eyes: EOM and lids are normal. Pupils are equal, round, and reactive to light.  Neck: Normal range of motion. Neck supple. Carotid bruit is not present.  Cardiovascular: Normal rate, regular rhythm, normal heart sounds, intact distal pulses and normal pulses.   Pulmonary/Chest: Breath sounds normal. No respiratory distress.  Abdominal: Soft. Bowel sounds are normal. There is no tenderness. There is no guarding.  Musculoskeletal: Normal range of motion.  There is no palpable step off of the cervical spine. There is some spasm at the base of the cervical spine extending into the upper trapezius area. There no hot areas appreciated.  Lymphadenopathy:       Head (right side): No submandibular adenopathy present.       Head (left side): No submandibular adenopathy present.    She has no cervical adenopathy.  Neurological: She is alert and oriented to person, place, and time. She has normal strength. No cranial nerve deficit or sensory deficit.  Grip is symmetrical. Gait is intact. There no sensory  or motor deficits appreciated on examination.  Skin: Skin is warm and dry.  Psychiatric: She has a normal mood and affect. Her speech is normal.  Nursing note and vitals reviewed.   ED Course  Procedures (including critical care time) Labs Review Labs Reviewed - No data to display  Imaging Review Dg Cervical Spine Complete  10/22/2015  CLINICAL DATA:  24 year old female who fell several weeks ago. Cervical neck pain radiating to the right upper extremity with tingling. Initial encounter. EXAM: CERVICAL SPINE -  COMPLETE 4+ VIEW COMPARISON:  None. FINDINGS: Normal prevertebral soft tissue contour. Cervicothoracic junction alignment is within normal limits. Bilateral posterior element alignment is within normal limits. Straightening and mild reversal of cervical lordosis. Relatively preserved disc spaces. Mild endplate spurring maximal at C5-C6. C1-C2 alignment and odontoid within normal limits. Normal AP alignment and lung apices, incidental right lung azygos fissure (normal variant). IMPRESSION: No acute osseous abnormality identified in the cervical spine. Loss of lordosis is nonspecific and might reflect soft tissue injury. Mild osseous degenerative changes maximal at C5-C6. Electronically Signed   By: Odessa FlemingH  Hall M.D.   On: 10/22/2015 15:40   I have personally reviewed and evaluated these images and lab results as part of my medical decision-making.   EKG Interpretation None      MDM  Vital signs are well within normal limits. X-ray of the cervical spine reveals mild loss of the lordosis curve. Is also degenerative changes mostly at the C5-C6 area. I discussed these findings with the patient in terms which he understands. The patient was reassured that there were no gross motor or sensory deficits appreciated on her examination. There is no fracture or dislocation noted on the x-ray film. The patient is treated with Decadron and Robaxin. The patient is to follow with Dr. Ophelia CharterYates, with the orthopedic specialist of her choice.    Final diagnoses:  None    *I have reviewed nursing notes, vital signs, and all appropriate lab and imaging results for this patient.[address]**    Cove Haydon, PA-C 10/22/15 1705  Bethann BerkshireJoseph Zammit, MD 10/22/15 [phone]

## 2016-12-18 ENCOUNTER — Other Ambulatory Visit: Payer: Self-pay | Admitting: Obstetrics & Gynecology

## 2016-12-18 DIAGNOSIS — O3680X0 Pregnancy with inconclusive fetal viability, not applicable or unspecified: Secondary | ICD-10-CM

## 2016-12-21 ENCOUNTER — Encounter: Payer: Self-pay | Admitting: *Deleted

## 2016-12-21 ENCOUNTER — Other Ambulatory Visit: Payer: Self-pay

## 2017-01-13 ENCOUNTER — Ambulatory Visit: Payer: Self-pay

## 2017-01-18 ENCOUNTER — Other Ambulatory Visit: Payer: Self-pay

## 2017-01-18 ENCOUNTER — Encounter: Payer: Self-pay | Admitting: Obstetrics & Gynecology

## 2017-02-05 IMAGING — DX DG CERVICAL SPINE COMPLETE 4+V
5 series · 5 of 5 positions shown · non-contrast
Comparison: None.

CLINICAL DATA: 24-year-old female who fell several weeks ago.
Cervical neck pain radiating to the right upper extremity with
tingling. Initial encounter.

EXAM:
CERVICAL SPINE - COMPLETE 4+ VIEW

[c-spine lat]
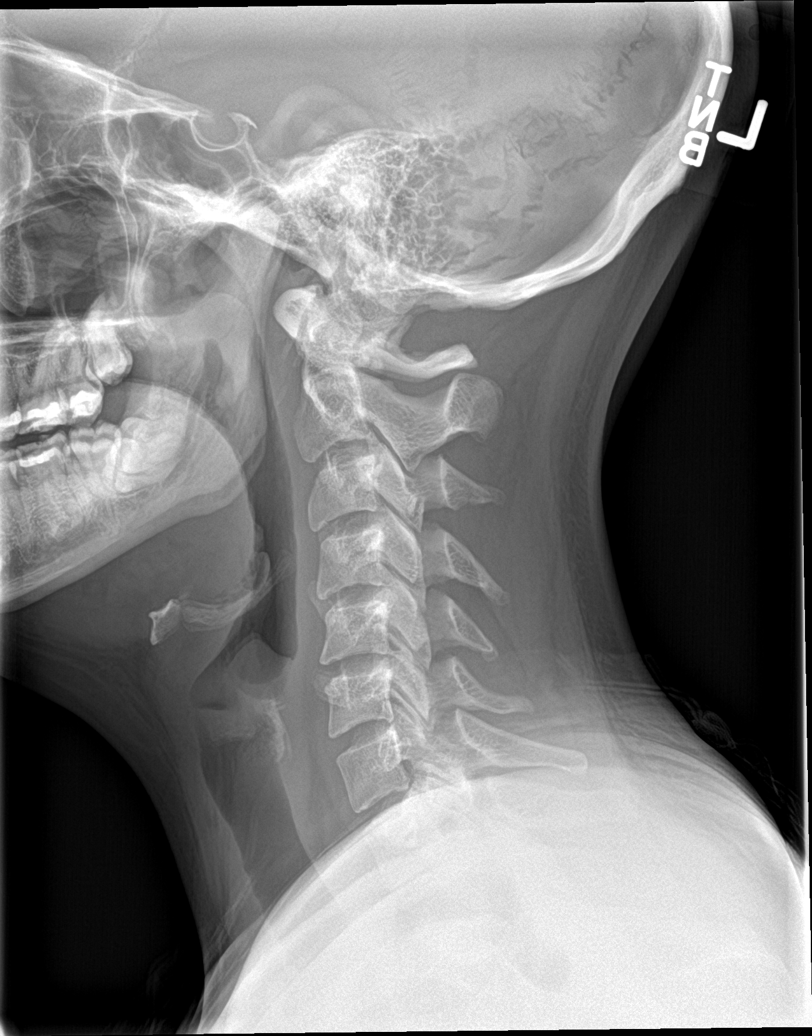

[c-spine obl (1 of 2)]
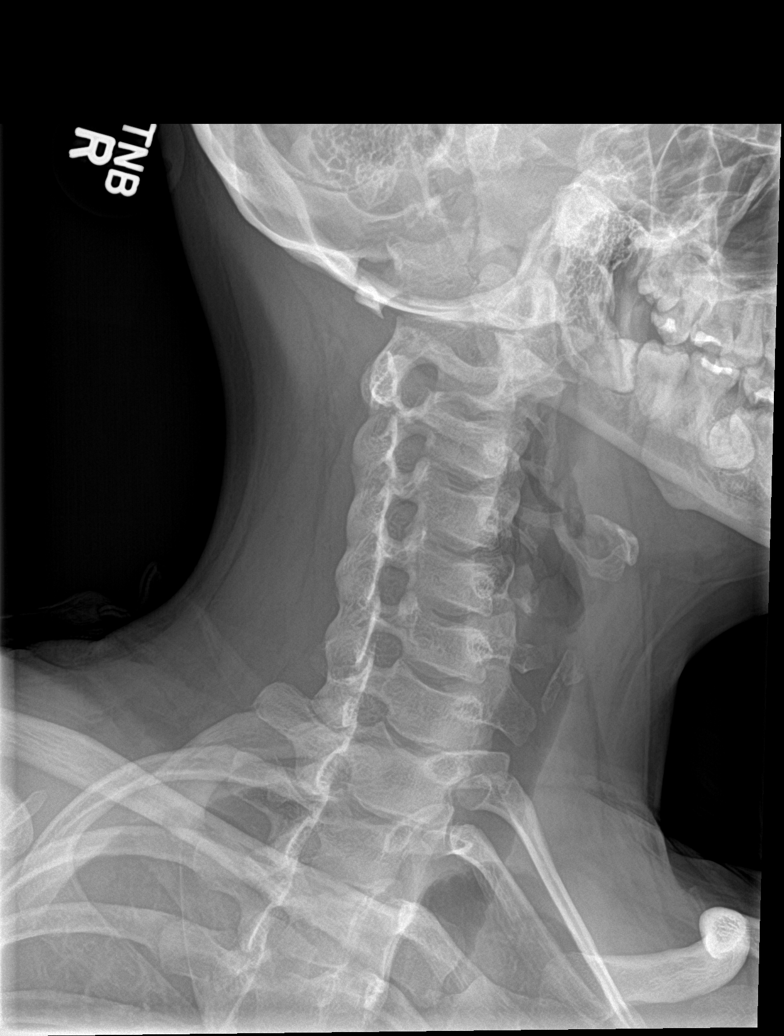

[c-spine obl (2 of 2)]
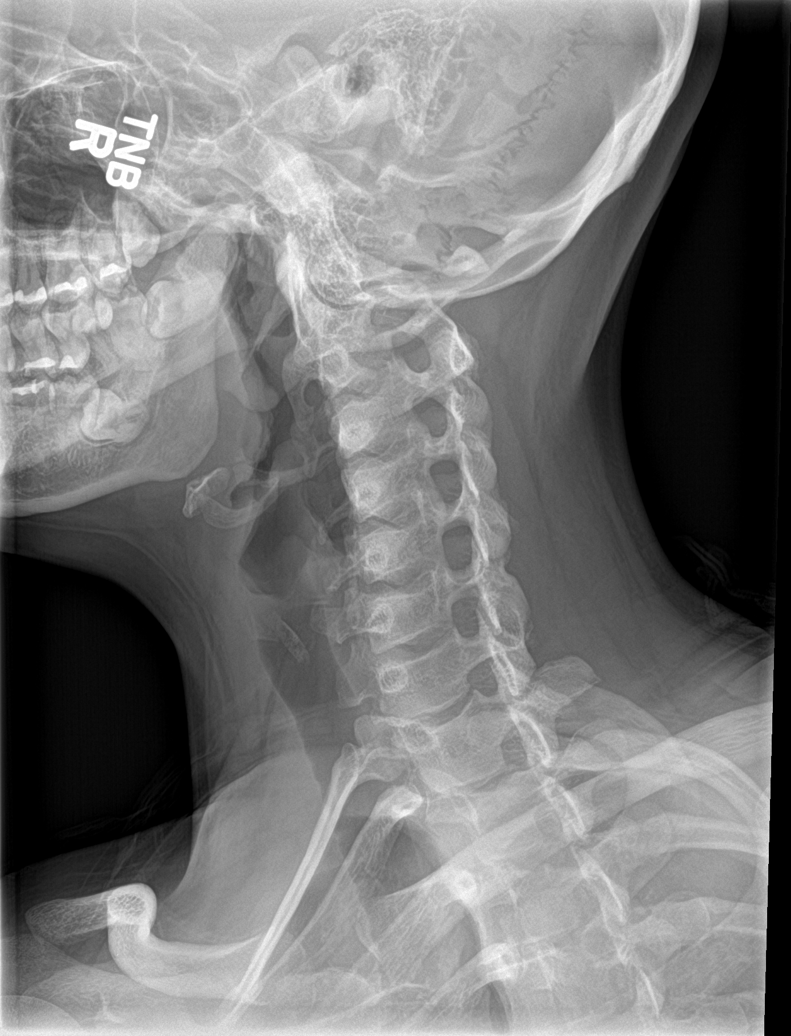

[c-spine ap]
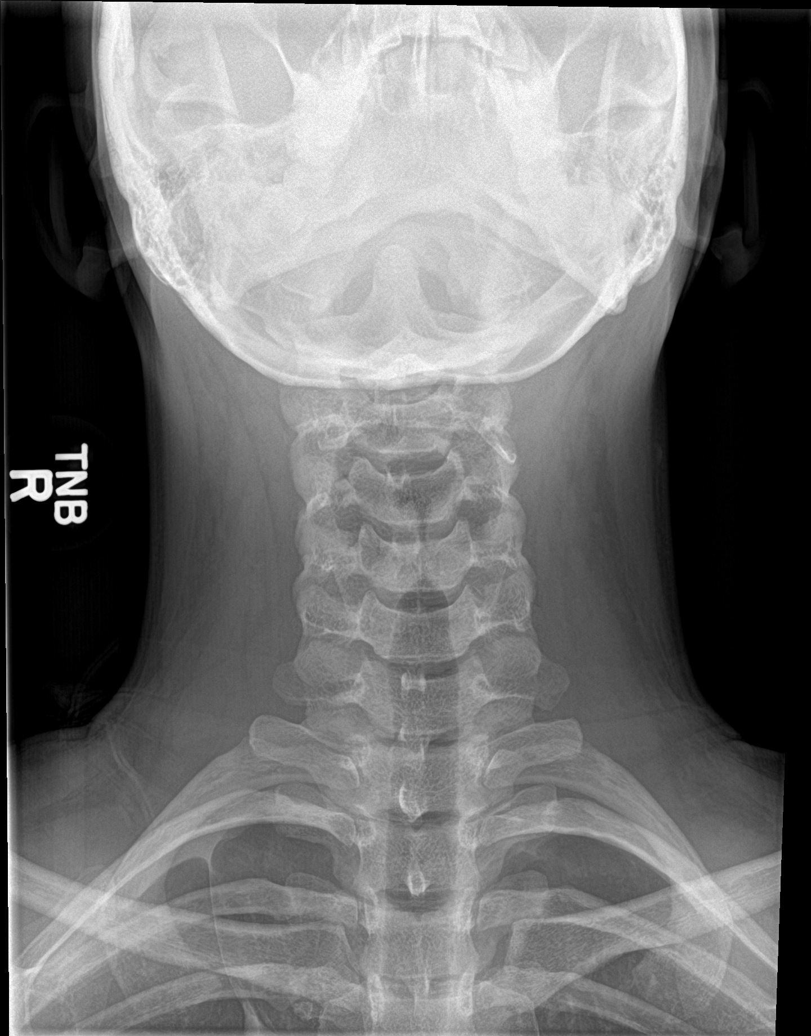

[c-spine open mouth]
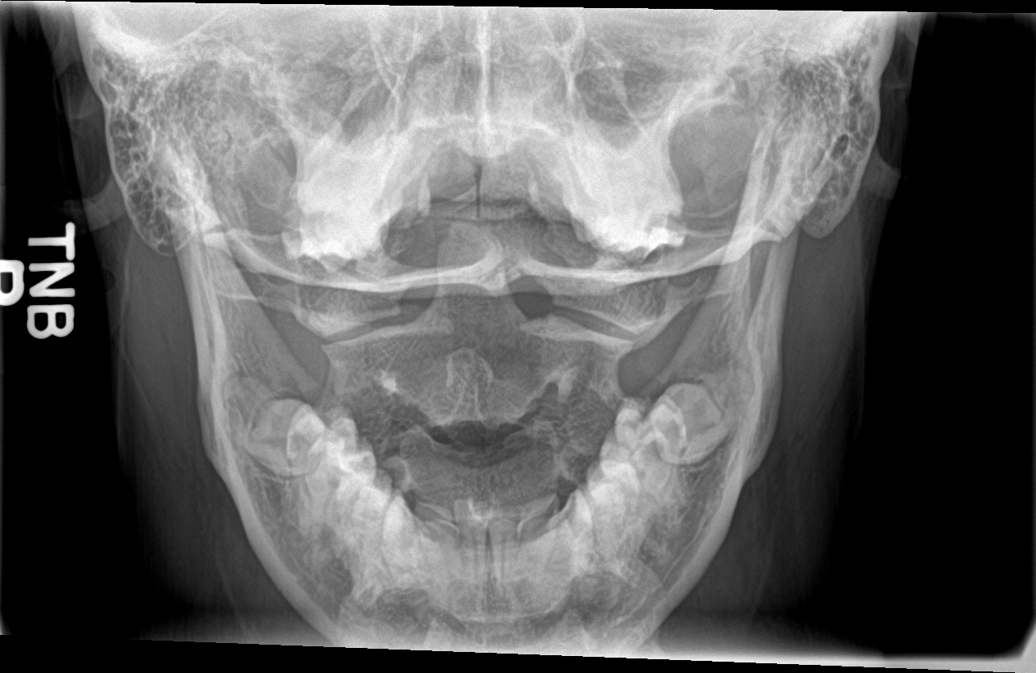

[5 of 5 positions shown; findings below may reference images not displayed]

FINDINGS: Normal prevertebral soft tissue contour. Cervicothoracic junction
alignment is within normal limits. Bilateral posterior element
alignment is within normal limits. Straightening and mild reversal
of cervical lordosis. Relatively preserved disc spaces. Mild
endplate spurring maximal at C5-C6. C1-C2 alignment and odontoid
within normal limits. Normal AP alignment and lung apices,
incidental right lung azygos fissure (normal variant).
IMPRESSION: No acute osseous abnormality identified in the cervical spine. Loss
of lordosis is nonspecific and might reflect soft tissue injury.
Mild osseous degenerative changes maximal at C5-C6.

## 2018-07-03 DIAGNOSIS — F319 Bipolar disorder, unspecified: Secondary | ICD-10-CM | POA: Insufficient documentation

## 2018-11-28 ENCOUNTER — Emergency Department (HOSPITAL_COMMUNITY)
Admission: EM | Admit: 2018-11-28 | Discharge: 2018-11-29 | Disposition: A | Discharge status: Home / Self Care | Payer: Medicaid - Out of State | Attending: Emergency Medicine | Admitting: Emergency Medicine

## 2018-11-28 ENCOUNTER — Other Ambulatory Visit: Payer: Self-pay

## 2018-11-28 DIAGNOSIS — R1084 Generalized abdominal pain: Secondary | ICD-10-CM | POA: Diagnosis present

## 2018-11-28 DIAGNOSIS — F17210 Nicotine dependence, cigarettes, uncomplicated: Secondary | ICD-10-CM | POA: Diagnosis not present

## 2018-11-28 LAB — CBC
HEMATOCRIT: 39.9 % (ref 36.0–46.0)
Hemoglobin: 13 g/dL (ref 12.0–15.0)
MCH: 31 pg (ref 26.0–34.0)
MCHC: 32.6 g/dL (ref 30.0–36.0)
MCV: 95 fL (ref 80.0–100.0)
NRBC: 0 % (ref 0.0–0.2)
Platelets: 313 10*3/uL (ref 150–400)
RBC: 4.2 MIL/uL (ref 3.87–5.11)
RDW: 13.4 % (ref 11.5–15.5)
WBC: 8.3 10*3/uL (ref 4.0–10.5)

## 2018-11-28 LAB — COMPREHENSIVE METABOLIC PANEL
ALT: 13 U/L (ref 0–44)
AST: 14 U/L — ABNORMAL LOW (ref 15–41)
Albumin: 4.3 g/dL (ref 3.5–5.0)
Alkaline Phosphatase: 58 U/L (ref 38–126)
Anion gap: 4 — ABNORMAL LOW (ref 5–15)
BILIRUBIN TOTAL: 0.4 mg/dL (ref 0.3–1.2)
BUN: 15 mg/dL (ref 6–20)
CHLORIDE: 109 mmol/L (ref 98–111)
CO2: 23 mmol/L (ref 22–32)
CREATININE: 0.53 mg/dL (ref 0.44–1.00)
Calcium: 9.1 mg/dL (ref 8.9–10.3)
Glucose, Bld: 101 mg/dL — ABNORMAL HIGH (ref 70–99)
POTASSIUM: 3.9 mmol/L (ref 3.5–5.1)
Sodium: 136 mmol/L (ref 135–145)
TOTAL PROTEIN: 7.2 g/dL (ref 6.5–8.1)

## 2018-11-28 LAB — URINALYSIS, ROUTINE W REFLEX MICROSCOPIC
BILIRUBIN URINE: NEGATIVE
Glucose, UA: NEGATIVE mg/dL
Hgb urine dipstick: NEGATIVE
KETONES UR: NEGATIVE mg/dL
LEUKOCYTES UA: NEGATIVE
NITRITE: NEGATIVE
PH: 6 (ref 5.0–8.0)
PROTEIN: NEGATIVE mg/dL
Specific Gravity, Urine: 1.018 (ref 1.005–1.030)

## 2018-11-28 LAB — LIPASE, BLOOD: LIPASE: 27 U/L (ref 11–51)

## 2018-11-28 LAB — PREGNANCY, URINE: PREG TEST UR: NEGATIVE

## 2018-11-28 MED ORDER — MORPHINE SULFATE (PF) 4 MG/ML IV SOLN
4.0000 mg | Freq: Once | INTRAVENOUS | Status: AC
  Administered 2018-11-28: 4 mg via INTRAVENOUS
  Filled 2018-11-28: qty 1

## 2018-11-28 MED ORDER — KETOROLAC TROMETHAMINE 30 MG/ML IJ SOLN
30.0000 mg | Freq: Once | INTRAMUSCULAR | Status: AC
  Administered 2018-11-29: 30 mg via INTRAVENOUS
  Filled 2018-11-28: qty 1

## 2018-11-28 MED ORDER — FAMOTIDINE IN NACL 20-0.9 MG/50ML-% IV SOLN
20.0000 mg | Freq: Once | INTRAVENOUS | Status: AC
  Administered 2018-11-29: 20 mg via INTRAVENOUS
  Filled 2018-11-28: qty 50

## 2018-11-28 MED ORDER — ONDANSETRON HCL 4 MG/2ML IJ SOLN
4.0000 mg | Freq: Once | INTRAMUSCULAR | Status: AC
  Administered 2018-11-29: 4 mg via INTRAVENOUS
  Filled 2018-11-28: qty 2

## 2018-11-28 MED ORDER — FENTANYL CITRATE (PF) 100 MCG/2ML IJ SOLN
100.0000 ug | Freq: Once | INTRAMUSCULAR | Status: AC
  Administered 2018-11-29: 100 ug via INTRAVENOUS
  Filled 2018-11-28: qty 2

## 2018-11-28 NOTE — ED Triage Notes (Signed)
Pt C/O left sided abdominal pain and nausea that started 6 months ago. Pt states she needs to see GI but "cannot get a referral."

## 2018-11-28 NOTE — ED Provider Notes (Signed)
[facility] EMERGENCY DEPARTMENT Provider Note   CSN: [id number] Arrival date & time: 11/28/18  2053     History   Chief Complaint Chief Complaint  Patient presents with  . Abdominal Pain   Patient gives permission to perform history and physical in front of family/friend HPI [name] is a 28 y.o. female.  The history is provided by the patient and a significant other.  Abdominal Pain  Pain location:  Generalized Pain severity:  Severe Timing:  Constant Progression:  Worsening Chronicity:  Chronic Relieved by:  Nothing Worsened by:  Nothing Associated symptoms: cough   Associated symptoms: no fever   Patient with history of depression, GERD, insomnia presents with abdominal pain she has had since she was 28 years old.  She reports he has this pain 2-3 times per week.  She is had multiple evaluations but no diagnosis has been given.  She is trying to see a gastroenterologist but due to insurance and transportation issues she has been unable to follow-up. This episode started yesterday. She reports at times her stool is loose on the times is hard.  She also reports difficulty urinating.  She has had an appendectomy, cholecystectomy, C-section  Past Medical History:  Diagnosis Date  . Depression   . GERD (gastroesophageal reflux disease)   . Insomnia   . Ulcer (HCC)     There are no active problems to display for this patient.   Past Surgical History:  Procedure Laterality Date  . APPENDECTOMY       OB History   No obstetric history on file.      Home Medications    Prior to Admission medications   Not on File    Family History No family history on file.  Social History Social History   Tobacco Use  . Smoking status: Current Every Day Smoker    Packs/day: 1.00    Years: 4.00    Pack years: 4.00    Types: Cigarettes  Substance Use Topics  . Alcohol use: Yes  . Drug use: No     Allergies   Amoxicillin; Penicillins; Tramadol;  Vicodin [hydrocodone-acetaminophen]; and Latex   Review of Systems Review of Systems  Constitutional: Negative for fever.  Respiratory: Positive for cough.   Gastrointestinal: Positive for abdominal pain.  Genitourinary: Positive for difficulty urinating.  Psychiatric/Behavioral: The patient is nervous/anxious.   All other systems reviewed and are negative.    Physical Exam Updated Vital Signs BP 125/82 (BP Location: Right Arm)   Pulse 86   Temp 98.7 F (37.1 C) (Oral)   Resp 18   Ht 1.575 m (5\' 2" )   Wt 61.2 kg   LMP 11/16/2018   SpO2 95%   BMI 24.69 kg/m   Physical Exam CONSTITUTIONAL: Well developed/well nourished, anxious, rocking back and forth in the bed HEAD: Normocephalic/atraumatic EYES: EOMI/PERRL ENMT: Mucous membranes moist NECK: supple no meningeal signs SPINE/BACK:entire spine nontender CV: S1/S2 noted, no murmurs/rubs/gallops noted LUNGS: Lungs are clear to auscultation bilaterally, no apparent distress ABDOMEN: soft, nontender, no rebound or guarding, bowel sounds noted throughout abdomen GU:no cva tenderness NEURO: Pt is awake/alert/appropriate, moves all extremitiesx4.  No facial droop.   EXTREMITIES: pulses normal/equal, full ROM SKIN: warm, color normal PSYCH: Anxious   ED Treatments / Results  Labs (all labs ordered are listed, but only abnormal results are displayed) Labs Reviewed  COMPREHENSIVE METABOLIC PANEL - Abnormal; Notable for the following components:      Result Value   Glucose, Bld  101 (*)    AST 14 (*)    Anion gap 4 (*)    All other components within normal limits  URINALYSIS, ROUTINE W REFLEX MICROSCOPIC - Abnormal; Notable for the following components:   APPearance HAZY (*)    All other components within normal limits  LIPASE, BLOOD  CBC  PREGNANCY, URINE    EKG None  Radiology No results found.  Procedures Procedures   Medications Ordered in ED Medications  morphine 4 MG/ML injection 4 mg (4 mg Intravenous  Given 11/28/18 2237)  fentaNYL (SUBLIMAZE) injection 100 mcg (100 mcg Intravenous Given 11/29/18 0004)  famotidine (PEPCID) IVPB 20 mg premix (0 mg Intravenous Stopped 11/29/18 0045)  ondansetron (ZOFRAN) injection 4 mg (4 mg Intravenous Given 11/29/18 0003)  ketorolac (TORADOL) 30 MG/ML injection 30 mg (30 mg Intravenous Given 11/29/18 0004)     Initial Impression / Assessment and Plan / ED Course  I have reviewed the triage vital signs and the nursing notes.  Pertinent labs  results that were available during my care of the patient were reviewed by me and considered in my medical decision making (see chart for details).     11:34 PM Patient presented for chronic abdominal pain she has had since the age of 54.  She reports this episode flared up about 2 days ago.  Labs are reassuring.  She would need referral to local gastroenterologist. 1:32 AM Pt improved Will start on promethazine for nausea Referred to GI for chronic abdominal pain  Final Clinical Impressions(s) / ED Diagnoses   Final diagnoses:  Generalized abdominal pain    ED Discharge Orders         Ordered    promethazine (PHENERGAN) 25 MG tablet  Every 6 hours PRN     11/29/18 0114           Zadie Rhine, MD 11/29/18 0133

## 2018-11-29 MED ORDER — PROMETHAZINE HCL 25 MG PO TABS: 25.0000 mg | ORAL_TABLET | Freq: Four times a day (QID) | ORAL | 0 refills | Status: DC | PRN

## 2018-12-27 ENCOUNTER — Encounter: Payer: Self-pay | Admitting: Gastroenterology

## 2019-03-09 ENCOUNTER — Ambulatory Visit: Payer: Medicaid - Out of State | Admitting: Gastroenterology

## 2019-03-09 ENCOUNTER — Other Ambulatory Visit: Payer: Self-pay

## 2019-03-09 ENCOUNTER — Telehealth: Payer: Self-pay | Admitting: Gastroenterology

## 2019-03-09 NOTE — Telephone Encounter (Signed)
Called patient TO DISCUSS COMPLAINT. NO ANSWER. LEFT VOICE MAIL. PT CAN CONTACT [facility] TO [facility].

## 2019-08-17 ENCOUNTER — Other Ambulatory Visit: Payer: Self-pay | Admitting: *Deleted

## 2019-08-17 DIAGNOSIS — Z20822 Contact with and (suspected) exposure to covid-19: Secondary | ICD-10-CM

## 2019-08-18 LAB — NOVEL CORONAVIRUS, NAA: SARS-CoV-2, NAA: NOT DETECTED

## 2019-10-26 ENCOUNTER — Other Ambulatory Visit: Payer: Self-pay

## 2019-10-26 DIAGNOSIS — Z20822 Contact with and (suspected) exposure to covid-19: Secondary | ICD-10-CM

## 2019-10-27 LAB — NOVEL CORONAVIRUS, NAA: SARS-CoV-2, NAA: DETECTED — AB

## 2020-07-25 ENCOUNTER — Encounter: Payer: Self-pay | Admitting: Internal Medicine

## 2020-08-28 ENCOUNTER — Encounter: Payer: Self-pay | Admitting: Internal Medicine

## 2020-08-28 ENCOUNTER — Ambulatory Visit: Payer: Medicaid Other | Admitting: Internal Medicine

## 2020-08-30 ENCOUNTER — Ambulatory Visit
Admission: EM | Admit: 2020-08-30 | Discharge: 2020-08-30 | Disposition: A | Discharge status: Home / Self Care | Payer: Medicaid Other | Attending: Emergency Medicine | Admitting: Emergency Medicine

## 2020-08-30 ENCOUNTER — Other Ambulatory Visit: Payer: Self-pay

## 2020-08-30 DIAGNOSIS — Z20822 Contact with and (suspected) exposure to covid-19: Secondary | ICD-10-CM

## 2020-08-30 DIAGNOSIS — R0602 Shortness of breath: Secondary | ICD-10-CM

## 2020-08-30 DIAGNOSIS — J069 Acute upper respiratory infection, unspecified: Secondary | ICD-10-CM | POA: Diagnosis not present

## 2020-08-30 DIAGNOSIS — R062 Wheezing: Secondary | ICD-10-CM

## 2020-08-30 DIAGNOSIS — Z1152 Encounter for screening for COVID-19: Secondary | ICD-10-CM

## 2020-08-30 DIAGNOSIS — R079 Chest pain, unspecified: Secondary | ICD-10-CM

## 2020-08-30 MED ORDER — ALBUTEROL SULFATE HFA 108 (90 BASE) MCG/ACT IN AERS: 2.0000 | INHALATION_SPRAY | Freq: Once | RESPIRATORY_TRACT | Status: DC

## 2020-08-30 MED ORDER — PREDNISONE 10 MG (21) PO TBPK: ORAL_TABLET | Freq: Every day | ORAL | 0 refills | Status: DC

## 2020-08-30 MED ORDER — BENZONATATE 100 MG PO CAPS
100.0000 mg | ORAL_CAPSULE | Freq: Three times a day (TID) | ORAL | 0 refills | Status: DC
Start: 2020-08-30 — End: 2023-06-14

## 2020-08-30 NOTE — ED Provider Notes (Signed)
[facility]   [id number] 08/30/20 Arrival Time: 1435   CC: COVID symptoms  SUBJECTIVE: History from: patient.  [name] is a 29 y.o. female who presents with brown/ yellow productive cough x few months, worsened over the past week.  Denies known sick exposure to COVID, flu or strep.  Family with similar symptoms.  Has tried OTC medications without relief.  Symptoms are made worse at night and with heat.  Reports previous symptoms in the past with bronchitis.  Also reports hx of covid infection, but symptoms were different.  Reports associated fever, tmax of 102.7 at home, runny nose, congestion, sore throat, wheezing, CP, SOB, and nausea.   Denies sinus pain, changes in bowel or bladder habits.     ROS: As per HPI.  All other pertinent ROS negative.     Past Medical History:  Diagnosis Date  . Depression   . GERD (gastroesophageal reflux disease)   . Insomnia   . Ulcer    Past Surgical History:  Procedure Laterality Date  . APPENDECTOMY     Allergies  Allergen Reactions  . Penicillins Shortness Of Breath  . Tramadol     Stomach burning/upset  . Vicodin [Hydrocodone-Acetaminophen]     Stomach burning  . Latex Itching and Rash   No current facility-administered medications on file prior to encounter.   Current Outpatient Medications on File Prior to Encounter  Medication Sig Dispense Refill  . [DISCONTINUED] promethazine (PHENERGAN) 25 MG tablet Take 1 tablet (25 mg total) by mouth every 6 (six) hours as needed for nausea or vomiting. 30 tablet 0   Social History   Socioeconomic History  . Marital status: Single    Spouse name: Not on file  . Number of children: Not on file  . Years of education: Not on file  . Highest education level: Not on file  Occupational History  . Not on file  Tobacco Use  . Smoking status: Current Every Day Smoker    Packs/day: 1.00    Years: 4.00    Pack years: 4.00    Types: Cigarettes  Substance and Sexual  Activity  . Alcohol use: Yes  . Drug use: No  . Sexual activity: Never  Other Topics Concern  . Not on file  Social History Narrative  . Not on file   Social Determinants of Health   Financial Resource Strain:   . Difficulty of Paying Living Expenses: Not on file  Food Insecurity:   . Worried About Programme researcher, broadcasting/film/video in the Last Year: Not on file  . Ran Out of Food in the Last Year: Not on file  Transportation Needs:   . Lack of Transportation (Medical): Not on file  . Lack of Transportation (Non-Medical): Not on file  Physical Activity:   . Days of Exercise per Week: Not on file  . Minutes of Exercise per Session: Not on file  Stress:   . Feeling of Stress : Not on file  Social Connections:   . Frequency of Communication with Friends and Family: Not on file  . Frequency of Social Gatherings with Friends and Family: Not on file  . Attends Religious Services: Not on file  . Active Member of Clubs or Organizations: Not on file  . Attends Banker Meetings: Not on file  . Marital Status: Not on file  Intimate Partner Violence:   . Fear of Current or Ex-Partner: Not on file  . Emotionally Abused: Not on file  .  Physically Abused: Not on file  . Sexually Abused: Not on file   History reviewed. No pertinent family history.  OBJECTIVE:  Vitals:   08/30/20 1509  BP: 118/79  Pulse: 100  Resp: 18  Temp: 98.5 F (36.9 C)  SpO2: 98%     General appearance: alert; appears fatigued, but nontoxic; speaking in full sentences and tolerating own secretions HEENT: NCAT; Ears: EACs clear, TMs pearly gray; Eyes: PERRL.  EOM grossly intact. Nose: nares patent without rhinorrhea, Throat: oropharynx clear, tonsils non erythematous or enlarged, uvula midline  Neck: supple without LAD Lungs: unlabored respirations, symmetrical air entry; cough: mild; no respiratory distress; subtle wheezes Heart: regular rate and rhythm.   Skin: warm and dry Psychological: alert and  cooperative; normal mood and affect  ASSESSMENT & PLAN:  1. Encounter for screening for COVID-19   2. Viral URI with cough   3. Wheezing   4. Chest pain, unspecified type   5. SOB (shortness of breath)   6. Suspected COVID-19 virus infection     Meds ordered this encounter  Medications  . predniSONE (STERAPRED UNI-PAK 21 TAB) 10 MG (21) TBPK tablet    Sig: Take by mouth daily. Take 6 tabs by mouth daily  for 2 days, then 5 tabs for 2 days, then 4 tabs for 2 days, then 3 tabs for 2 days, 2 tabs for 2 days, then 1 tab by mouth daily for 2 days    Dispense:  42 tablet    Refill:  0    Order Specific Question:   Supervising Provider    Answer:   Eustace Moore [[id number]]  . benzonatate (TESSALON) 100 MG capsule    Sig: Take 1 capsule (100 mg total) by mouth every 8 (eight) hours.    Dispense:  21 capsule    Refill:  0    Order Specific Question:   Supervising Provider    Answer:   Eustace Moore [[id number]]  . albuterol (VENTOLIN HFA) 108 (90 Base) MCG/ACT inhaler 2 puff    Unable to rule out cardiac disease or blood clot in urgent care setting given symptoms of chest pain and SOB.  Offered patient further evaluation and management in the ED.  Patient declines at this time and would like to try outpatient therapy first.  Aware of the risk associated with this decision including missed diagnosis, organ damage, organ failure, and/or death.  Patient aware and in agreement.     COVID testing ordered.  It will take between 5-7 days for test results.  Someone will contact you regarding abnormal results.   In the meantime: You should remain isolated in your home for 10 days from symptom onset AND greater than 72 hours after symptoms resolution (absence of fever without the use of fever-reducing medication and improvement in respiratory symptoms), whichever is longer Get plenty of rest and push fluids Albuterol inhaler given in office Prednisone prescribed Tessalon Perles prescribed for  cough Use OTC zyrtec for nasal congestion, runny nose, and/or sore throat Use OTC flonase for nasal congestion and runny nose Use medications daily for symptom relief Use OTC medications like ibuprofen or tylenol as needed fever or pain Call or go to the ED if you have any new or worsening symptoms such as fever, worsening cough, shortness of breath, chest tightness, chest pain, turning blue, changes in mental status, etc...   Reviewed expectations re: course of current medical issues. Questions answered. Outlined signs and symptoms indicating need for more acute intervention.  Patient verbalized understanding. After Visit Summary given.         Rennis Harding, PA-C 08/30/20 1601

## 2020-08-30 NOTE — Discharge Instructions (Signed)
Unable to rule out cardiac disease or blood clot in urgent care setting given symptoms of chest pain and SOB.  Offered patient further evaluation and management in the ED.  Patient declines at this time and would like to try outpatient therapy first.  Aware of the risk associated with this decision including missed diagnosis, organ damage, organ failure, and/or death.  Patient aware and in agreement.     COVID testing ordered.  It will take between 5-7 days for test results.  Someone will contact you regarding abnormal results.   In the meantime: You should remain isolated in your home for 10 days from symptom onset AND greater than 72 hours after symptoms resolution (absence of fever without the use of fever-reducing medication and improvement in respiratory symptoms), whichever is longer Get plenty of rest and push fluids Albuterol inhaler given in office Prednisone prescribed Tessalon Perles prescribed for cough Use OTC zyrtec for nasal congestion, runny nose, and/or sore throat Use OTC flonase for nasal congestion and runny nose Use medications daily for symptom relief Use OTC medications like ibuprofen or tylenol as needed fever or pain Call or go to the ED if you have any new or worsening symptoms such as fever, worsening cough, shortness of breath, chest tightness, chest pain, turning blue, changes in mental status, etc..Marland Kitchen

## 2020-08-30 NOTE — ED Triage Notes (Signed)
Pt presents with c/o cough for past 3 months  Reports fever on Monday

## 2020-08-31 LAB — SARS-COV-2, NAA 2 DAY TAT

## 2020-08-31 LAB — NOVEL CORONAVIRUS, NAA: SARS-CoV-2, NAA: NOT DETECTED

## 2020-09-18 ENCOUNTER — Encounter (HOSPITAL_COMMUNITY): Payer: Self-pay | Admitting: *Deleted

## 2020-09-18 ENCOUNTER — Emergency Department (HOSPITAL_COMMUNITY): Payer: Medicaid - Out of State

## 2020-09-18 ENCOUNTER — Other Ambulatory Visit: Payer: Self-pay

## 2020-09-18 ENCOUNTER — Emergency Department (HOSPITAL_COMMUNITY)
Admission: EM | Admit: 2020-09-18 | Discharge: 2020-09-18 | Disposition: A | Discharge status: Home / Self Care | Payer: Medicaid - Out of State | Attending: Emergency Medicine | Admitting: Emergency Medicine

## 2020-09-18 DIAGNOSIS — F17210 Nicotine dependence, cigarettes, uncomplicated: Secondary | ICD-10-CM | POA: Diagnosis not present

## 2020-09-18 DIAGNOSIS — R0789 Other chest pain: Secondary | ICD-10-CM | POA: Diagnosis present

## 2020-09-18 DIAGNOSIS — R079 Chest pain, unspecified: Secondary | ICD-10-CM

## 2020-09-18 DIAGNOSIS — Z91040 Latex allergy status: Secondary | ICD-10-CM | POA: Insufficient documentation

## 2020-09-18 LAB — CBC WITH DIFFERENTIAL/PLATELET
Abs Immature Granulocytes: 0.03 10*3/uL (ref 0.00–0.07)
Basophils Absolute: 0.1 10*3/uL (ref 0.0–0.1)
Basophils Relative: 1 %
Eosinophils Absolute: 0.3 10*3/uL (ref 0.0–0.5)
Eosinophils Relative: 2 %
HCT: 39.3 % (ref 36.0–46.0)
Hemoglobin: 13.2 g/dL (ref 12.0–15.0)
Immature Granulocytes: 0 %
Lymphocytes Relative: 27 %
Lymphs Abs: 3.1 10*3/uL (ref 0.7–4.0)
MCH: 31.9 pg (ref 26.0–34.0)
MCHC: 33.6 g/dL (ref 30.0–36.0)
MCV: 94.9 fL (ref 80.0–100.0)
Monocytes Absolute: 0.9 10*3/uL (ref 0.1–1.0)
Monocytes Relative: 8 %
Neutro Abs: 7.3 10*3/uL (ref 1.7–7.7)
Neutrophils Relative %: 62 %
Platelets: 329 10*3/uL (ref 150–400)
RBC: 4.14 MIL/uL (ref 3.87–5.11)
RDW: 13.5 % (ref 11.5–15.5)
WBC: 11.8 10*3/uL — ABNORMAL HIGH (ref 4.0–10.5)
nRBC: 0 % (ref 0.0–0.2)

## 2020-09-18 LAB — BASIC METABOLIC PANEL
Anion gap: 8 (ref 5–15)
BUN: 17 mg/dL (ref 6–20)
CO2: 24 mmol/L (ref 22–32)
Calcium: 8.8 mg/dL — ABNORMAL LOW (ref 8.9–10.3)
Chloride: 103 mmol/L (ref 98–111)
Creatinine, Ser: 0.83 mg/dL (ref 0.44–1.00)
GFR, Estimated: >60 mL/min (ref >60)
Glucose, Bld: 88 mg/dL (ref 70–99)
Potassium: 3.6 mmol/L (ref 3.5–5.1)
Sodium: 135 mmol/L (ref 135–145)

## 2020-09-18 LAB — TROPONIN I (HIGH SENSITIVITY): Troponin I (High Sensitivity): 2 ng/L (ref <18)

## 2020-09-18 NOTE — Discharge Instructions (Addendum)
Take ibuprofen 600 mg every 6 hours as needed for pain. ° °Follow-up with your primary doctor if symptoms are not improving in the next few days, and return to the ER if symptoms significantly worsen or change. °

## 2020-09-18 NOTE — ED Provider Notes (Signed)
[facility] EMERGENCY DEPARTMENT Provider Note   CSN: [id number] Arrival date & time: 09/18/20  0234     History Chief Complaint  Patient presents with  . Chest Pain    [name] is a 29 y.o. female.  Patient is a 29 year old female with past medical history of depression, GERD, insomnia.  She presents today for evaluation of chest discomfort.  Patient reports a 2-week history of cough.  She has been seen by primary doctor and prescribed antibiotics, however has not improved.  Yesterday she developed sharp pain in her chest and feels short of breath.  She denies nausea or diaphoresis.  She denies any radiation into her arm or jaw.  Patient's history somewhat limited as she appears very somnolent and difficult to arouse.  The history is provided by the patient.  Chest Pain Pain location:  Substernal area Pain quality: sharp   Pain radiates to:  Does not radiate Pain severity:  Moderate Onset quality:  Sudden Duration:  24 hours Timing:  Constant Progression:  Unchanged Chronicity:  New Relieved by:  Nothing Worsened by:  Nothing Ineffective treatments:  None tried      Past Medical History:  Diagnosis Date  . Depression   . GERD (gastroesophageal reflux disease)   . Insomnia   . Ulcer     There are no problems to display for this patient.   Past Surgical History:  Procedure Laterality Date  . APPENDECTOMY       OB History   No obstetric history on file.     History reviewed. No pertinent family history.  Social History   Tobacco Use  . Smoking status: Current Every Day Smoker    Packs/day: 1.00    Years: 4.00    Pack years: 4.00    Types: Cigarettes  Substance Use Topics  . Alcohol use: Yes  . Drug use: No    Home Medications Prior to Admission medications   Medication Sig Start Date End Date Taking? Authorizing Provider  benzonatate (TESSALON) 100 MG capsule Take 1 capsule (100 mg total) by mouth every 8 (eight) hours. 08/30/20    Wurst, [place], PA-C  predniSONE (STERAPRED UNI-PAK 21 TAB) 10 MG (21) TBPK tablet Take by mouth daily. Take 6 tabs by mouth daily  for 2 days, then 5 tabs for 2 days, then 4 tabs for 2 days, then 3 tabs for 2 days, 2 tabs for 2 days, then 1 tab by mouth daily for 2 days 08/30/20   Alvino Chapel, [place], PA-C  promethazine (PHENERGAN) 25 MG tablet Take 1 tablet (25 mg total) by mouth every 6 (six) hours as needed for nausea or vomiting. 11/29/18 08/30/20  Zadie Rhine, MD    Allergies    Penicillins, Tramadol, Vicodin [hydrocodone-acetaminophen], and Latex  Review of Systems   Review of Systems  Cardiovascular: Positive for chest pain.  All other systems reviewed and are negative.   Physical Exam Updated Vital Signs BP 124/77 (BP Location: Right Arm)   Pulse 94   Temp 98.7 F (37.1 C) (Oral)   Resp 18   Ht 5\' 2"  (1.575 m)   Wt 56.7 kg   LMP 08/26/2020   SpO2 99%   BMI 22.86 kg/m   Physical Exam Vitals and nursing note reviewed.  Constitutional:      General: She is not in acute distress.    Appearance: She is well-developed. She is not diaphoretic.  HENT:     Head: Normocephalic and atraumatic.  Cardiovascular:  Rate and Rhythm: Normal rate and regular rhythm.     Heart sounds: No murmur heard.  No friction rub. No gallop.   Pulmonary:     Effort: Pulmonary effort is normal. No respiratory distress.     Breath sounds: Normal breath sounds. No wheezing.     Comments: There is tenderness to palpation of the anterior chest wall.  This reproduces her symptoms. Abdominal:     General: Bowel sounds are normal. There is no distension.     Palpations: Abdomen is soft.     Tenderness: There is no abdominal tenderness.  Musculoskeletal:        General: Normal range of motion.     Cervical back: Normal range of motion and neck supple.     Right lower leg: No tenderness. No edema.     Left lower leg: No tenderness. No edema.  Skin:    General: Skin is warm and dry.   Neurological:     Mental Status: She is alert and oriented to person, place, and time.     ED Results / Procedures / Treatments   Labs (all labs ordered are listed, but only abnormal results are displayed) Labs Reviewed  BASIC METABOLIC PANEL  CBC WITH DIFFERENTIAL/PLATELET  TROPONIN I (HIGH SENSITIVITY)    EKG EKG Interpretation  Date/Time:  Wednesday September 18 2020 03:37:37 EDT Ventricular Rate:  86 PR Interval:    QRS Duration: 106 QT Interval:  372 QTC Calculation: 445 R Axis:   76 Text Interpretation: Sinus rhythm RSR' in V1 or V2, likely normal variant No prior ecg for comparison Confirmed by Geoffery Lyons ([id number]) on 09/18/2020 3:40:21 AM   Radiology No results found.  Procedures Procedures (including critical care time)  Medications Ordered in ED Medications - No data to display  ED Course  I have reviewed the triage vital signs and the nursing notes.  Pertinent labs & imaging results that were available during my care of the patient were reviewed by me and considered in my medical decision making (see chart for details).    MDM Rules/Calculators/A&P  Patient presenting with chest pain as described in the HPI.  Her symptoms are very atypical for cardiac pain, she has no cardiac risk factors, vitals are stable, and work-up is completely unremarkable despite 24 hours of symptoms.  I highly doubt a cardiac etiology and strongly suspect a musculoskeletal etiology or possibly some component of anxiety as this seems to be going during a stressful episode.  Patient seems appropriate for discharge with anti-inflammatory medication and follow-up with primary doctor.  Final Clinical Impression(s) / ED Diagnoses Final diagnoses:  None    Rx / DC Orders ED Discharge Orders    None       Geoffery Lyons, MD 09/18/20 [phone]

## 2020-09-18 NOTE — ED Triage Notes (Signed)
Pt c/o chest pain that started yesterday; pt states she was "stressing" when the pain started; sob with the pain as well

## 2022-01-03 IMAGING — DX DG CHEST 1V PORT
1 series · 1 of 1 positions shown · non-contrast
Comparison: 09/04/2020

CLINICAL DATA: Chest pain

EXAM:
PORTABLE CHEST 1 VIEW

[chest ap]
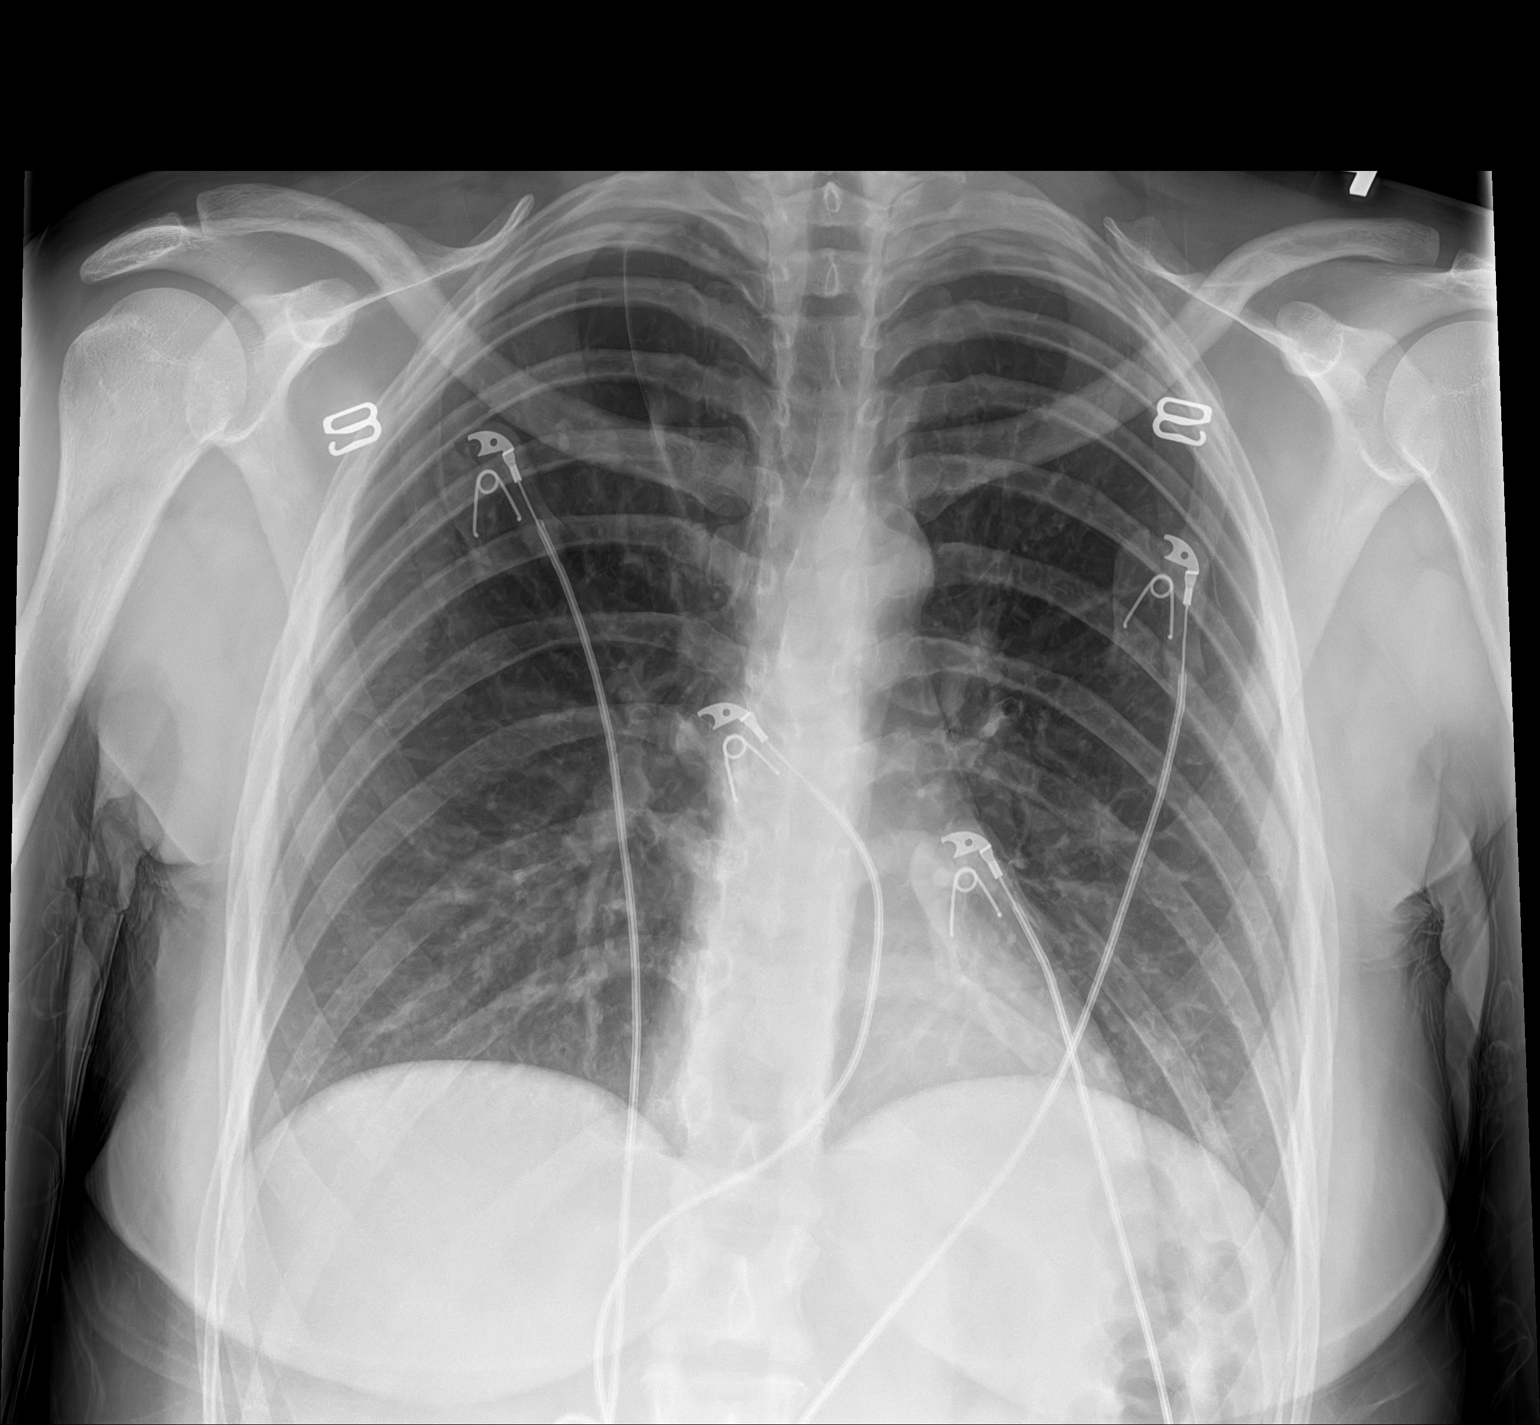

[1 of 1 positions shown; findings below may reference images not displayed]

FINDINGS: The heart size and mediastinal contours are within normal limits.
Both lungs are clear. The visualized skeletal structures are
unremarkable.
IMPRESSION: Normal study.

## 2022-01-16 DIAGNOSIS — K219 Gastro-esophageal reflux disease without esophagitis: Secondary | ICD-10-CM | POA: Diagnosis not present

## 2022-01-16 DIAGNOSIS — R079 Chest pain, unspecified: Secondary | ICD-10-CM | POA: Diagnosis not present

## 2022-01-16 DIAGNOSIS — I4510 Unspecified right bundle-branch block: Secondary | ICD-10-CM | POA: Diagnosis not present

## 2022-01-16 DIAGNOSIS — R0789 Other chest pain: Secondary | ICD-10-CM | POA: Diagnosis not present

## 2022-01-16 DIAGNOSIS — R69 Illness, unspecified: Secondary | ICD-10-CM | POA: Diagnosis not present

## 2022-04-11 ENCOUNTER — Emergency Department (HOSPITAL_COMMUNITY)
Admission: EM | Admit: 2022-04-11 | Discharge: 2022-04-11 | Discharge status: Against Medical Advice | Payer: Medicaid Other | Source: Home / Self Care | Attending: Emergency Medicine | Admitting: Emergency Medicine

## 2023-05-05 DIAGNOSIS — Z1329 Encounter for screening for other suspected endocrine disorder: Secondary | ICD-10-CM | POA: Diagnosis not present

## 2023-05-05 DIAGNOSIS — R5383 Other fatigue: Secondary | ICD-10-CM | POA: Diagnosis not present

## 2023-05-11 NOTE — Progress Notes (Deleted)
   [name], female    DOB: 1991-01-08    MRN: [id number]   Brief patient profile:  32  yo*** *** referred to pulmonary clinic in [place]  05/12/2023 by *** for ***      History of Present Illness  05/12/2023  Pulmonary/ 1st office eval/ Sherene Sires /  Office  No chief complaint on file.    Dyspnea:  *** Cough: *** Sleep: *** SABA use: *** 02: *** Lung cancer screen: ***   Outpatient Medications Prior to Visit  Medication Sig Dispense Refill   benzonatate (TESSALON) 100 MG capsule Take 1 capsule (100 mg total) by mouth every 8 (eight) hours. 21 capsule 0   predniSONE (STERAPRED UNI-PAK 21 TAB) 10 MG (21) TBPK tablet Take by mouth daily. Take 6 tabs by mouth daily  for 2 days, then 5 tabs for 2 days, then 4 tabs for 2 days, then 3 tabs for 2 days, 2 tabs for 2 days, then 1 tab by mouth daily for 2 days 42 tablet 0   No facility-administered medications prior to visit.    Past Medical History:  Diagnosis Date   Depression    GERD (gastroesophageal reflux disease)    Insomnia    Ulcer       Objective:     There were no vitals taken for this visit.         Assessment   No problem-specific Assessment & Plan notes found for this encounter.     Sandrea Hughs, MD 05/11/2023

## 2023-05-12 ENCOUNTER — Encounter: Payer: Self-pay | Admitting: Internal Medicine

## 2023-05-12 ENCOUNTER — Institutional Professional Consult (permissible substitution): Payer: 59 | Admitting: Internal Medicine

## 2023-06-14 NOTE — Progress Notes (Signed)
[name], female    DOB: Aug 21, 1991    MRN: [id number]   Brief patient profile:  31  yowf born 2 m prematureactive smoker had good ex tol  referred to pulmonary clinic in [place]  06/15/2023 by Dr [name]  for sob then cp x 2020 covid.   History of Present Illness  06/15/2023  Pulmonary/ 1st Lopez eval/ [name] / [name]  Chief Complaint  Patient presents with   Establish Care   Shortness of Breath   Nicotine Dependence  Dyspnea:  slow walks fine, trouble with hills  Cough: worse first thing in am > min  clear mucus  Sleep: flat bed one pillow never has cp in this position  SABA use: no better  02: none  Cp several times a week lasting up to 15-20 sec each time same location ant  RUQ / LUQ never both sides at once  "feels like a bubble in me"  GERD rx no better   No obvious day to day or daytime pattern/variability or assoc excess/ purulent sputum or mucus plugs or hemoptysis or chest tightness, subjective wheeze or overt sinus or hb symptoms.   S.eeping  without nocturnal  or early am exacerbation  of respiratory  c/o's or need for noct saba. Also denies any obvious fluctuation of symptoms with weather or environmental changes or other aggravating or alleviating factors except as outlined above   No unusual exposure hx or h/o childhood pna/ asthma or knowledge of premature birth.  Current Allergies, Complete Past Medical History, Past Surgical History, Family History, and Social History were reviewed in [employer] record.  ROS  The following are not active complaints unless bolded Hoarseness, sore throat, dysphagia, dental problems, itching, sneezing,  nasal congestion or discharge of excess mucus or purulent secretions, ear ache,   fever, chills, sweats, unintended wt loss or wt gain, classically  exertional cp,  orthopnea pnd or arm/hand swelling  or leg swelling, presyncope, palpitations, abdominal pain, anorexia, nausea, vomiting,  diarrhea  or change in bowel habits or change in bladder habits, change in stools or change in urine, dysuria, hematuria,  rash, arthralgias, visual complaints, headache, numbness, weakness or ataxia or problems with walking or coordination,  change in mood or  memory.            Outpatient Medications Prior to Visit  Medication Sig Dispense Refill   VENTOLIN HFA 108 (90 Base) MCG/ACT inhaler Inhale 2 puffs into the lungs every 4 (four) hours as needed.     topiramate (TOPAMAX) 25 MG tablet Take 25 mg by mouth at bedtime.     benzonatate (TESSALON) 100 MG capsule Take 1 capsule (100 mg total) by mouth every 8 (eight) hours. 21 capsule 0   predniSONE (STERAPRED UNI-PAK 21 TAB) 10 MG (21) TBPK tablet Take by mouth daily. Take 6 tabs by mouth daily  for 2 days, then 5 tabs for 2 days, then 4 tabs for 2 days, then 3 tabs for 2 days, 2 tabs for 2 days, then 1 tab by mouth daily for 2 days 42 tablet 0   No facility-administered medications prior to visit.    Past Medical History:  Diagnosis Date   Depression    GERD (gastroesophageal reflux disease)    Insomnia    Ulcer       Objective:     BP 131/81   Pulse 79   Ht 5\' 2"  (1.575 m)   Wt 142 lb (64.4 kg)  SpO2 97%   BMI 25.97 kg/m   SpO2: 97 %  RA  Amb pleasant wf nad   HEENT : Oropharynx  clear       NECK :  without  apparent JVD/ palpable Nodes/TM    LUNGS: no acc muscle use,  Nl contour chest which is clear to A and P bilaterally without cough on insp or exp maneuvers   CV:  RRR  no s3 or murmur or increase in P2, and no edema   ABD:  soft and nontender with nl inspiratory excursion in the supine position. No bruits or organomegaly appreciated   MS:  Nl gait/ ext warm without deformities Or obvious joint restrictions  calf tenderness, cyanosis or clubbing    SKIN: warm and dry without lesions    NEURO:  alert, approp, nl sensorium with  no motor or cerebellar deficits apparent.       I personally reviewed  images and agree with radiology impression as follows:  CXR:   portable 02/05/21  Possible calcified pulmonary nodule in the right upper lobe. Azygos  fissure.  Normal heart size and mediastinal contours. No acute infiltrate or  edema. No effusion or pneumothorax. No acute osseous findings    CXR PA and Lateral:   06/15/2023 :    I personally reviewed images and impression is as follows:     Wnl   Assessment   Chest pain, atypical Onset around 2022  c/w IBS - Rx citrucel/ diet 06/15/2023 >>>   Classic subdiaphragmatic pain pattern suggests ibs:  Stereotypical, migratory with a very limited distribution of pain locations, daytime, not usually exacerbated by exercise  or coughing, worse in sitting position, frequently associated with generalized abd bloating, not as likely to be present supine due to the dome effect of the diaphragm which  is  canceled in that position. Frequently these patients have had multiple negative GI workups and CT scans.  Treatment consists of avoiding foods that cause gas (especially boiled eggs, mexcican food but especially  beans and undercooked vegetables like  spinach and some salads)  and citrucel 1 heaping tsp twice daily with a large glass of water.  Pain should improve w/in 2 weeks and if not then consider further work up.     DOE (dyspnea on exertion) Onset around 2020 ? Post covid though never nl ex tol s/p ? 2 m premature - Active smoker  - 06/15/2023   Walked on RA  x  3  lap(s) =  approx 450  ft  @ fast pace, stopped due to end of study  with lowest 02 sats 98% actually 99% on last lap.   Concerned about smoking in pt 2 m premature and advised of the significance of copd impact on lungs which may never have reached nl size/ function but no need to do pfts at this point as minimally symptomatic.   Rec: Regular ex/ focus on smoking cessation  (see separate a/p)   Cigarette smoker Counseled re importance of smoking cessation but did not meet time  criteria for separate billing     Each maintenance medication was reviewed in detail including emphasizing most importantly the difference between maintenance and prns and under what circumstances the prns are to be triggered using an action plan format where appropriate.  Total time for H and P, chart review, counseling,  directly observing portions of ambulatory 02 saturation study/ and generating customized AVS unique to this Lopez visit / same day charting = 45 min  new pt eval                    Sandrea Hughs, MD 06/15/2023

## 2023-06-15 ENCOUNTER — Ambulatory Visit (INDEPENDENT_AMBULATORY_CARE_PROVIDER_SITE_OTHER): Payer: 59 | Admitting: Internal Medicine

## 2023-06-15 ENCOUNTER — Ambulatory Visit (HOSPITAL_COMMUNITY)
Admission: RE | Admit: 2023-06-15 | Discharge: 2023-06-15 | Disposition: A | Discharge status: Home / Self Care | Payer: 59 | Source: Ambulatory Visit | Attending: Internal Medicine | Admitting: Internal Medicine

## 2023-06-15 ENCOUNTER — Encounter: Payer: Self-pay | Admitting: Internal Medicine

## 2023-06-15 VITALS — BP 131/81 | HR 79 | Ht 62.0 in | Wt 142.0 lb

## 2023-06-15 DIAGNOSIS — F17210 Nicotine dependence, cigarettes, uncomplicated: Secondary | ICD-10-CM

## 2023-06-15 DIAGNOSIS — R0609 Other forms of dyspnea: Secondary | ICD-10-CM | POA: Insufficient documentation

## 2023-06-15 DIAGNOSIS — R079 Chest pain, unspecified: Secondary | ICD-10-CM | POA: Diagnosis not present

## 2023-06-15 DIAGNOSIS — K589 Irritable bowel syndrome without diarrhea: Secondary | ICD-10-CM | POA: Diagnosis not present

## 2023-06-15 DIAGNOSIS — R0789 Other chest pain: Secondary | ICD-10-CM | POA: Diagnosis not present

## 2023-06-15 NOTE — Patient Instructions (Addendum)
Classic pain pattern suggests ibs:  migratory with a very limited distribution of pain locations, daytime, not  worse  coughing, worse in sitting position, frequently associated with generalized abd bloating, not as likely to be present supine due to the dome effect of the diaphragm which  is  canceled in that position. Frequently these patients have had multiple negative GI workups and CT scans.  Treatment consists of avoiding foods that cause gas (especially boiled eggs, mexcican food but especially  beans and undercooked vegetables like  spinach and some salads)  and citrucel 1 heaping tsp twice daily with a large glass of water.  Pain should improve w/in 2 weeks and if not then consider further   work up.      Please remember to go to the  x-ray department  for your tests - we will call you with the results when they are available  and whether you need follow   The key is to stop smoking completely before smoking completely stops you!

## 2023-06-15 NOTE — Assessment & Plan Note (Signed)
Counseled re importance of smoking cessation but did not meet time criteria for separate billing    Each maintenance medication was reviewed in detail including emphasizing most importantly the difference between maintenance and prns and under what circumstances the prns are to be triggered using an action plan format where appropriate.  Total time for H and P, chart review, counseling,  directly observing portions of ambulatory 02 saturation study/ and generating customized AVS unique to this office visit / same day charting > 45 min new pt eval

## 2023-06-15 NOTE — Assessment & Plan Note (Signed)
Onset around 2020 ? Post covid though never nl ex tol s/p ? 2 m premature - Active smoker  - 06/15/2023   Walked on RA  x  3  lap(s) =  approx 450  ft  @ fast pace, stopped due to end of study  with lowest 02 sats 98% actually 99% on last lap.   Concerned about smoking in pt 2 m premature and advised of the significance of copd impact on lungs which may never have reached nl size/ function but no need to do pfts at this point as minimally symptomatic.   Rec: Regular ex/ focus on smoking cessation  (see separate a/p)

## 2023-06-15 NOTE — Assessment & Plan Note (Signed)
Onset around 2022  c/w IBS - Rx citrucel/ diet 06/15/2023 >>>   Classic subdiaphragmatic pain pattern suggests ibs:  Stereotypical, migratory with a very limited distribution of pain locations, daytime, not usually exacerbated by exercise  or coughing, worse in sitting position, frequently associated with generalized abd bloating, not as likely to be present supine due to the dome effect of the diaphragm which  is  canceled in that position. Frequently these patients have had multiple negative GI workups and CT scans.  Treatment consists of avoiding foods that cause gas (especially boiled eggs, mexcican food but especially  beans and undercooked vegetables like  spinach and some salads)  and citrucel 1 heaping tsp twice daily with a large glass of water.  Pain should improve w/in 2 weeks and if not then consider further work up.

## 2023-08-29 DIAGNOSIS — Z91040 Latex allergy status: Secondary | ICD-10-CM | POA: Diagnosis not present

## 2023-08-29 DIAGNOSIS — F32A Depression, unspecified: Secondary | ICD-10-CM | POA: Diagnosis not present

## 2023-08-29 DIAGNOSIS — Z882 Allergy status to sulfonamides status: Secondary | ICD-10-CM | POA: Diagnosis not present

## 2023-08-29 DIAGNOSIS — F17210 Nicotine dependence, cigarettes, uncomplicated: Secondary | ICD-10-CM | POA: Diagnosis not present

## 2023-08-29 DIAGNOSIS — H938X2 Other specified disorders of left ear: Secondary | ICD-10-CM | POA: Diagnosis not present

## 2023-08-29 DIAGNOSIS — H9202 Otalgia, left ear: Secondary | ICD-10-CM | POA: Diagnosis not present

## 2023-08-29 DIAGNOSIS — H66002 Acute suppurative otitis media without spontaneous rupture of ear drum, left ear: Secondary | ICD-10-CM | POA: Diagnosis not present

## 2023-08-29 DIAGNOSIS — F603 Borderline personality disorder: Secondary | ICD-10-CM | POA: Diagnosis not present

## 2023-08-29 DIAGNOSIS — F419 Anxiety disorder, unspecified: Secondary | ICD-10-CM | POA: Diagnosis not present

## 2023-08-29 DIAGNOSIS — F209 Schizophrenia, unspecified: Secondary | ICD-10-CM | POA: Diagnosis not present

## 2023-08-29 DIAGNOSIS — K219 Gastro-esophageal reflux disease without esophagitis: Secondary | ICD-10-CM | POA: Diagnosis not present

## 2023-10-14 DIAGNOSIS — Z885 Allergy status to narcotic agent status: Secondary | ICD-10-CM | POA: Diagnosis not present

## 2023-10-14 DIAGNOSIS — G4700 Insomnia, unspecified: Secondary | ICD-10-CM | POA: Diagnosis not present

## 2023-10-14 DIAGNOSIS — M25472 Effusion, left ankle: Secondary | ICD-10-CM | POA: Diagnosis not present

## 2023-10-14 DIAGNOSIS — S99912A Unspecified injury of left ankle, initial encounter: Secondary | ICD-10-CM | POA: Diagnosis not present

## 2023-10-14 DIAGNOSIS — X501XXA Overexertion from prolonged static or awkward postures, initial encounter: Secondary | ICD-10-CM | POA: Diagnosis not present

## 2023-10-14 DIAGNOSIS — W1789XA Other fall from one level to another, initial encounter: Secondary | ICD-10-CM | POA: Diagnosis not present

## 2023-10-14 DIAGNOSIS — Z882 Allergy status to sulfonamides status: Secondary | ICD-10-CM | POA: Diagnosis not present

## 2023-10-14 DIAGNOSIS — F17210 Nicotine dependence, cigarettes, uncomplicated: Secondary | ICD-10-CM | POA: Diagnosis not present

## 2023-10-14 DIAGNOSIS — Z91040 Latex allergy status: Secondary | ICD-10-CM | POA: Diagnosis not present

## 2023-10-14 DIAGNOSIS — S93402A Sprain of unspecified ligament of left ankle, initial encounter: Secondary | ICD-10-CM | POA: Diagnosis not present

## 2023-10-14 DIAGNOSIS — K219 Gastro-esophageal reflux disease without esophagitis: Secondary | ICD-10-CM | POA: Diagnosis not present

## 2023-10-14 DIAGNOSIS — M25572 Pain in left ankle and joints of left foot: Secondary | ICD-10-CM | POA: Diagnosis not present
